# Patient Record
Sex: Female | Born: 1975 | Race: White | Hispanic: Yes | Marital: Married | Smoking: Never smoker
Health system: Southern US, Community
[De-identification: ages and names within clinical notes are randomized; demographics above are authoritative.]

## PROBLEM LIST (undated history)

## (undated) HISTORY — PX: TUBAL LIGATION: SHX77

---

## 2007-11-22 ENCOUNTER — Emergency Department (HOSPITAL_COMMUNITY): Admission: EM | Admit: 2007-11-22 | Discharge: 2007-11-22 | Payer: Self-pay | Admitting: Emergency Medicine

## 2010-10-01 NOTE — Consult Note (Signed)
NAMEMAHA, FISCHEL NO.:  [id number]   MEDICAL RECORD NO.:  [id number]          PATIENT TYPE:  EMS   LOCATION:  [facility]                         FACILITY:  [facility]   PHYSICIAN:  Artist Pais. Weingold, M.D.DATE OF BIRTH:  1976/05/19   DATE OF CONSULTATION:  11/22/2007  DATE OF DISCHARGE:                                 CONSULTATION   REASON FOR CONSULTATION:  Miranda Gates is a 35 year old female.  She is  right-hand dominant.  She presents today with injury to right index  finger with open nail bed laceration, distal phalangeal fracture, gross  contamination.  She is 28.  She is right-hand dominant.   No known drug allergies.   No current medications.   No recent hospitalizations or surgery.   FAMILY HISTORY:  Noncontributory.   SOCIAL HISTORY:  Noncontributory.   PHYSICAL EXAMINATION:  A well-nourished female, pleasant, alert,  oriented x3.  Examination of her upper extremity on the right, she has  an obvious open nail bed injury with exposed distal phalangeal bone  avulsion of the nail plate underneath the eponychial fold, open  fracture, etc.   X-ray showed distal phalangeal fracture.   She was given 2% plain lidocaine and a digital sheath block.  When  anesthesia obtained, she was prepped and draped in usual sterile  fashion.  The nail plate was carefully elevated off underlying nail bed.  Open fracture was debrided.  The nail bed itself was repaired with 6-0  chromic followed by 4-0 Vicryl Rapide, volar complex skin laceration.  Once this was done, she was placed in a sterile dressing, Xeroform,  4x4s, and Coban wrap.  The patient was discharged from emergency  department with Vicodin and Keflex.  Follow up with my office this  Thursday.      Artist Pais Mina Marble, M.D.  Electronically Signed     MAW/MEDQ  D:  11/22/2007  T:  11/23/2007  Job:  [id number]

## 2010-11-19 ENCOUNTER — Other Ambulatory Visit: Payer: Self-pay | Admitting: Internal Medicine

## 2010-11-19 ENCOUNTER — Other Ambulatory Visit: Payer: Self-pay | Admitting: Obstetrics and Gynecology

## 2010-11-19 DIAGNOSIS — N6320 Unspecified lump in the left breast, unspecified quadrant: Secondary | ICD-10-CM

## 2010-11-19 DIAGNOSIS — N644 Mastodynia: Secondary | ICD-10-CM

## 2010-11-29 ENCOUNTER — Other Ambulatory Visit: Payer: Self-pay

## 2010-12-02 ENCOUNTER — Ambulatory Visit
Admission: RE | Admit: 2010-12-02 | Discharge: 2010-12-02 | Disposition: A | Discharge status: Home / Self Care | Payer: Self-pay | Source: Ambulatory Visit | Attending: Internal Medicine | Admitting: Internal Medicine

## 2010-12-02 ENCOUNTER — Other Ambulatory Visit: Payer: Self-pay | Admitting: Internal Medicine

## 2010-12-02 DIAGNOSIS — N644 Mastodynia: Secondary | ICD-10-CM

## 2010-12-02 DIAGNOSIS — N6320 Unspecified lump in the left breast, unspecified quadrant: Secondary | ICD-10-CM

## 2010-12-05 ENCOUNTER — Ambulatory Visit
Admission: RE | Admit: 2010-12-05 | Discharge: 2010-12-05 | Disposition: A | Discharge status: Home / Self Care | Payer: Self-pay | Source: Ambulatory Visit | Attending: Internal Medicine | Admitting: Internal Medicine

## 2010-12-05 DIAGNOSIS — N6320 Unspecified lump in the left breast, unspecified quadrant: Secondary | ICD-10-CM

## 2010-12-05 DIAGNOSIS — N644 Mastodynia: Secondary | ICD-10-CM

## 2015-09-27 ENCOUNTER — Other Ambulatory Visit: Payer: Self-pay

## 2015-09-27 ENCOUNTER — Other Ambulatory Visit: Payer: Self-pay | Admitting: Specialist

## 2015-09-27 DIAGNOSIS — N6002 Solitary cyst of left breast: Secondary | ICD-10-CM

## 2015-10-22 ENCOUNTER — Other Ambulatory Visit (HOSPITAL_COMMUNITY): Payer: Self-pay | Admitting: *Deleted

## 2015-10-22 DIAGNOSIS — N6002 Solitary cyst of left breast: Secondary | ICD-10-CM

## 2015-11-08 ENCOUNTER — Ambulatory Visit
Admission: RE | Admit: 2015-11-08 | Discharge: 2015-11-08 | Disposition: A | Discharge status: Home / Self Care | Payer: No Typology Code available for payment source | Source: Ambulatory Visit | Attending: Obstetrics and Gynecology | Admitting: Obstetrics and Gynecology

## 2015-11-08 ENCOUNTER — Encounter (HOSPITAL_COMMUNITY): Payer: Self-pay

## 2015-11-08 ENCOUNTER — Ambulatory Visit (HOSPITAL_COMMUNITY)
Admission: RE | Admit: 2015-11-08 | Discharge: 2015-11-08 | Disposition: A | Discharge status: Home / Self Care | Payer: Self-pay | Source: Ambulatory Visit | Attending: Obstetrics and Gynecology | Admitting: Obstetrics and Gynecology

## 2015-11-08 VITALS — BP 102/60 | Temp 98.2°F | Ht 64.0 in | Wt 187.2 lb

## 2015-11-08 DIAGNOSIS — N6002 Solitary cyst of left breast: Secondary | ICD-10-CM

## 2015-11-08 DIAGNOSIS — Z1239 Encounter for other screening for malignant neoplasm of breast: Secondary | ICD-10-CM

## 2015-11-08 DIAGNOSIS — N644 Mastodynia: Secondary | ICD-10-CM

## 2015-11-08 NOTE — Patient Instructions (Signed)
Educational materials on self breast awareness given. Explained to [facility] Jayme CloudGonzalez that she did not need a Pap smear today due to last Pap smear was in April 2017 per patient. Let her know BCCCP will cover Pap smears every 3 years unless has a history of abnormal Pap smears. [facility]. Appointment scheduled for Thursday, November 08, 2015 at 1000. Rubin PayorEdith Delagarza Jayme CloudGonzalez verbalized understanding.  Romualdo Prosise, Kathaleen Maserhristine Poll, RN 11:34 AM

## 2015-11-08 NOTE — Progress Notes (Signed)
Complaints of occasional breast pain prior to menstrual cycles. Patient had a left breast ultrasound guided aspiration 12/05/2010 that a left breast ultrasound was recommended in 3 months. Patient has not followed up since aspiration.   Pap Smear:  Pap smear not completed today. Last Pap smear was in April 2017 at Urgent Care and normal per patient. Per patient has no history of an abnormal Pap smear. No Pap smear results are in EPIC.  Physical exam: Breasts Breasts symmetrical. No skin abnormalities bilateral breasts. No nipple retraction bilateral breasts. No nipple discharge bilateral breasts. No lymphadenopathy. No lumps palpated bilateral breasts. Complaints of left outer breast pain on exam. [facility]. Appointment scheduled for Thursday, November 08, 2015 at 1000.  Pelvic/Bimanual No Pap smear completed today since last Pap smear was in April 2017. Pap smear not indicated per BCCCP guidelines.   Smoking History: Patient has never smoked.  Patient Navigation: Patient education provided. Access to services provided for patient [facility]. Spanish interpreter provided.    Used Spanish interpreter [employer] from [employer].

## 2015-11-12 ENCOUNTER — Encounter (HOSPITAL_COMMUNITY): Payer: Self-pay | Admitting: *Deleted

## 2015-11-14 ENCOUNTER — Telehealth: Payer: Self-pay

## 2015-11-14 NOTE — Telephone Encounter (Signed)
Called per Albertina SenegalMarly Adams  to remind of WISEWOMAN appointment and to be fasting. [facility]

## 2015-11-15 ENCOUNTER — Other Ambulatory Visit: Payer: Self-pay

## 2015-11-15 ENCOUNTER — Ambulatory Visit: Payer: Self-pay

## 2015-11-15 VITALS — BP 100/66 | HR 62 | Temp 98.2°F | Resp 14 | Ht 63.0 in | Wt 183.4 lb

## 2015-11-15 DIAGNOSIS — Z0000 Encounter for general adult medical examination without abnormal findings: Secondary | ICD-10-CM

## 2015-11-15 LAB — LIPID PANEL
CHOL/HDL RATIO: 3.1 ratio (ref 0.0–4.4)
Cholesterol, Total: 147 mg/dL (ref 100–199)
HDL: 47 mg/dL (ref >39)
LDL Calculated: 81 mg/dL (ref 0–99)
TRIGLYCERIDES: 97 mg/dL (ref 0–149)
VLDL Cholesterol Cal: 19 mg/dL (ref 5–40)

## 2015-11-15 NOTE — Progress Notes (Signed)
Patient [facility] and is currently a BCCCP patient effective 08/23/2015. Patient is present with interpreter Albertina SenegalMarly Adams.  Clinical Measurements: Patient is 5 ft. 3 inches, weight 183.4 lbs, and BMI 32.6.   Medical History: Patient stated that was told two years ago that her cholesterol was a little high but nothing to worry about. Patient does not have a history of hypertension but did have gestational diabetes during last pregnancy. Per patient no diagnosed history of coronary heart disease, heart attack, heart failure, stroke/TIA, vascular disease or congenital heart defects.   Blood Pressure, Self-measurement: Patient states has no reason to check Blood pressure.  Nutrition Assessment: Patient stated that eats 2 to 3 fruits every day. Patient states she eats 2 servings of vegetables a day. Per patient does not eat 3 or more ounces of whole grains daily. Patient doesn't eat two or more servings of fish weekly. Patient states that eats fish once a week. Patient states she does not drink more than 36 ounces or 450 calories of beverages with added sugars weekly. Patient states she drinks a lot of water. Patient stated she does not  watch her salt intake. Wellness health coaching was inititated at this time.  Physical Activity Assessment: Patient stated she walks faster when her weight gets to 185 or above.  Patient stated has probably been doing 195 minutes of moderate exercise a week and no vigorous exercise.  Smoking Status: Patient stated that has never smoked and is not exposed to smoke.  Quality of Life Assessment: In assessing patient's physical quality of life she stated that out of the past 30 days that she has felt her health was good all days. Patient also stated that in the past 30 days that her mental health was  good including stress, depression and problems with emotions for all days. Patient did state that out of the past 30 days she felt her physical or  mental health had not kept her from doing her usual activities including self-care, work or recreation for all days.   Plan: Lab work will be done today including a lipid panel and Hgb A1C. Will call lab results when they are finished. Patient will receive Second Health Coaching when results are called and then as patient wants.

## 2015-11-15 NOTE — Patient Instructions (Signed)
Discussed health assessment with patient. Will increase amount of omega 3 fish eats per week. She will be called with results of lab work and we will then discussed any further follow up the patient needs. Patient verbalized understanding.

## 2015-11-15 NOTE — Progress Notes (Signed)
FIRST HEALTH COACH: During initial screening with Albertina SenegalMarly Adams interpreter present, wellness health coaching was started.  Nutrition: Discussed with patient that normally should eat 2 servings of fruits and 2 to 2 1/2 vegetables a day. Informed patient that two servings of fish were recommended per week and prefer omega 3's. Listed the fishes with omega three's for patient.Discussed that should have 5 to 7 ounces of fiber a day. Explained to patient that should eat fruits and vegetables high in fiber but 3 of her 5 to 7 grains of fiber should be whole grain. Examples of whole grains were given.   Informed about sugar and told need to decrease salt intake.   Physical Activity:  Patient was Informed that minimal moderate activity is 150 minutes a week or 75 minutes of vigorous. Patient is doing her minimal moderate exercise. Discussed adding in some flexeibily and balance exercises.  PLAN: Will set goal when call lab results and do second health coaching. Will think about what goals may want to set.

## 2015-11-16 ENCOUNTER — Telehealth: Payer: Self-pay

## 2015-11-16 LAB — HEMOGLOBIN A1C
ESTIMATED AVERAGE GLUCOSE: 108 mg/dL
HEMOGLOBIN A1C: 5.4 % (ref 4.8–5.6)

## 2015-11-16 NOTE — Telephone Encounter (Addendum)
LAB RESULTS: Patient was called by Miranda Gates interpreter to inform about lab work from 11/15/15. Interpreter informed patient: cholesterol- 147, HDL- 47, LDL- 81, triglycerides - 97,  HBG-A1C - 5.4, and BMI 32.6 (obese).  SECOND HEALTH COACHING: Did risk reduction counseling/Heach Coach concerning related to BMI/Weight.  We discussed how starches break down into sugar in the body and the need to watch size and number of servings that eat a day. Discussed the need to loose weight, cut back calories, serving sizes,not fry, and exercise more.. Examples were given of each of the past subject matter.  Patient stated that thought could loose weight but would like to come in for one on one health coaching to make sure she knows how to eat healthier.  PLAN: Patient will put above recommendation slowly into action. Patient will come for one on one Health Coaching July 13.

## 2015-11-28 ENCOUNTER — Telehealth: Payer: Self-pay

## 2015-11-28 NOTE — Telephone Encounter (Signed)
Called per Miranda SenegalMarly Adams to remind of [facility]. Reminded patient of  [facility]

## 2015-11-29 ENCOUNTER — Ambulatory Visit: Payer: No Typology Code available for payment source

## 2015-12-27 ENCOUNTER — Telehealth: Payer: Self-pay

## 2015-12-27 NOTE — Telephone Encounter (Signed)
THIRD HEALTH COACHING Patient called per phone today for Health Coaching with interpreter Albertina SenegalMarly Adams present. Patient's areas of concern were weight and exercise.  HEALTH COACHING: Patient stated had decreased the amount of food she is eating. Per patient has increased the amount of vegetables that she is eating. Patient stated that she has lost 4 pounds. States is watching her portion sizes and trying to eat better. Per patient is exercising 45 minutes on most days.  PLAN: Will Call in a few weeks. Will continue with increasing exercise. Will continue with the good meal and nutrition changes. Will be doing follow up assessment when call next.

## 2016-06-30 ENCOUNTER — Telehealth: Payer: Self-pay

## 2016-06-30 NOTE — Telephone Encounter (Signed)
Wisewoman - Follow up Screening  Assessment: F/U screening.   Patient's goal was to lose weight and exercise  Initial Screening date:11/15/2015  Medication Status: Patient is not currently taking any medications  Blood Pressure Measurement: Patient is not taking BP at this time  Nutrition: patient consumes 2 cups on average of vegetables and fruit daily, eats fish once weekly, Eats whole grains but not more than 3 ounces daily, does not drink any sugary drinks and is not currently watching her sodium intake.   Physical Activity: Patient has moderate to vigorous physical activity 30 minutes twice weekly  Smoking Status: Patient is not a current smoker  Quality of Life: Patient denies any emotional issues at this time.  Navigation:  Access through [facility]ise[facility] program provided.

## 2016-11-05 ENCOUNTER — Other Ambulatory Visit (HOSPITAL_COMMUNITY): Payer: Self-pay | Admitting: *Deleted

## 2016-11-05 DIAGNOSIS — N6320 Unspecified lump in the left breast, unspecified quadrant: Secondary | ICD-10-CM

## 2016-11-07 ENCOUNTER — Other Ambulatory Visit (HOSPITAL_COMMUNITY): Payer: Self-pay | Admitting: *Deleted

## 2016-11-07 DIAGNOSIS — N644 Mastodynia: Secondary | ICD-10-CM

## 2016-11-07 DIAGNOSIS — N6320 Unspecified lump in the left breast, unspecified quadrant: Secondary | ICD-10-CM

## 2016-11-20 ENCOUNTER — Ambulatory Visit
Admission: RE | Admit: 2016-11-20 | Discharge: 2016-11-20 | Disposition: A | Discharge status: Home / Self Care | Payer: No Typology Code available for payment source | Source: Ambulatory Visit | Attending: Obstetrics and Gynecology | Admitting: Obstetrics and Gynecology

## 2016-11-20 ENCOUNTER — Ambulatory Visit (HOSPITAL_COMMUNITY)
Admission: RE | Admit: 2016-11-20 | Discharge: 2016-11-20 | Disposition: A | Discharge status: Home / Self Care | Payer: Self-pay | Source: Ambulatory Visit | Attending: Obstetrics and Gynecology | Admitting: Obstetrics and Gynecology

## 2016-11-20 ENCOUNTER — Encounter (HOSPITAL_COMMUNITY): Payer: Self-pay | Admitting: *Deleted

## 2016-11-20 VITALS — BP 108/64 | Ht 63.0 in | Wt 178.2 lb

## 2016-11-20 DIAGNOSIS — N6320 Unspecified lump in the left breast, unspecified quadrant: Secondary | ICD-10-CM

## 2016-11-20 DIAGNOSIS — N6325 Unspecified lump in the left breast, overlapping quadrants: Secondary | ICD-10-CM

## 2016-11-20 DIAGNOSIS — N6321 Unspecified lump in the left breast, upper outer quadrant: Secondary | ICD-10-CM

## 2016-11-20 DIAGNOSIS — Z1239 Encounter for other screening for malignant neoplasm of breast: Secondary | ICD-10-CM

## 2016-11-20 DIAGNOSIS — N644 Mastodynia: Secondary | ICD-10-CM

## 2016-11-20 NOTE — Progress Notes (Signed)
Complaints of left breast lump x 1.5 months and left breast pain x one month that comes and goes. Patient rates the pain at a 7-8 out of 10.  Pap Smear: Pap smear not completed today. Last Pap smear was in April 2017 at an Urgent Care and normal per patient. Per patient has no history of an abnormal Pap smear. No Pap smear results are in EPIC.  Physical exam: Breasts Breasts symmetrical. No skin abnormalities bilateral breasts. No nipple retraction bilateral breasts. No nipple discharge bilateral breasts. No lymphadenopathy. No lumps palpated right breast. Palpated two lumps within the left breast at 2 o'clock 2 cm from the nipple and 3 o'clock 2 cm from the nipple. Complaints of bilateral outer breast tenderness and pain when palpated lump on exam. [facility] and possible left breast ultrasound. Appointment scheduled for Thursday, November 20, 2016 at 0850.        Pelvic/Bimanual No Pap smear completed today since last Pap smear was in April 2017 per patient. Pap smear not indicated per BCCCP guidelines.   Smoking History: Patient has never smoked.  Patient Navigation: Patient education provided. Access to services provided for patient through [facility]. Spanish interpreter was provided.  Used Spanish interpreter [employer] McReynolds from [employer].

## 2016-11-20 NOTE — Patient Instructions (Signed)
Explained breast self awareness with Miranda Gates. Patient did not need a Pap smear today due to last Pap smear was in April 2017 per patient. Let her know BCCCP will cover Pap smears every 3 years unless has a history of abnormal Pap smears. [facility] and possible left breast ultrasound. Appointment scheduled for Thursday, November 20, 2016 at 0850. Rubin PayorEdith Delagarza Jayme CloudGonzalez verbalized understanding.  Honesti Seaberg, Kathaleen Maserhristine Poll, RN 8:57 AM

## 2016-11-21 ENCOUNTER — Encounter (HOSPITAL_COMMUNITY): Payer: Self-pay | Admitting: *Deleted

## 2017-03-09 ENCOUNTER — Encounter (HOSPITAL_COMMUNITY): Payer: Self-pay

## 2017-12-04 ENCOUNTER — Other Ambulatory Visit: Payer: Self-pay | Admitting: Obstetrics and Gynecology

## 2017-12-04 DIAGNOSIS — Z1231 Encounter for screening mammogram for malignant neoplasm of breast: Secondary | ICD-10-CM

## 2018-01-21 ENCOUNTER — Ambulatory Visit (HOSPITAL_COMMUNITY): Payer: Self-pay

## 2018-04-20 ENCOUNTER — Ambulatory Visit: Payer: No Typology Code available for payment source

## 2018-04-20 ENCOUNTER — Ambulatory Visit (HOSPITAL_COMMUNITY): Payer: Self-pay

## 2018-05-18 ENCOUNTER — Encounter (HOSPITAL_COMMUNITY): Payer: Self-pay | Admitting: *Deleted

## 2018-11-01 ENCOUNTER — Emergency Department (HOSPITAL_BASED_OUTPATIENT_CLINIC_OR_DEPARTMENT_OTHER): Payer: Self-pay

## 2018-11-01 ENCOUNTER — Encounter (HOSPITAL_BASED_OUTPATIENT_CLINIC_OR_DEPARTMENT_OTHER): Payer: Self-pay | Admitting: *Deleted

## 2018-11-01 ENCOUNTER — Other Ambulatory Visit: Payer: Self-pay

## 2018-11-01 ENCOUNTER — Emergency Department (HOSPITAL_BASED_OUTPATIENT_CLINIC_OR_DEPARTMENT_OTHER)
Admission: EM | Admit: 2018-11-01 | Discharge: 2018-11-02 | Disposition: A | Discharge status: Home / Self Care | Payer: Self-pay | Attending: Emergency Medicine | Admitting: Emergency Medicine

## 2018-11-01 DIAGNOSIS — M7918 Myalgia, other site: Secondary | ICD-10-CM | POA: Insufficient documentation

## 2018-11-01 DIAGNOSIS — U071 COVID-19: Secondary | ICD-10-CM | POA: Insufficient documentation

## 2018-11-01 DIAGNOSIS — R51 Headache: Secondary | ICD-10-CM | POA: Insufficient documentation

## 2018-11-01 LAB — BASIC METABOLIC PANEL
Anion gap: 8 (ref 5–15)
BUN: 6 mg/dL (ref 6–20)
CO2: 24 mmol/L (ref 22–32)
Calcium: 8.3 mg/dL — ABNORMAL LOW (ref 8.9–10.3)
Chloride: 103 mmol/L (ref 98–111)
Creatinine, Ser: 0.79 mg/dL (ref 0.44–1.00)
GFR calc Af Amer: >60 mL/min (ref >60)
GFR calc non Af Amer: >60 mL/min (ref >60)
Glucose, Bld: 133 mg/dL — ABNORMAL HIGH (ref 70–99)
Potassium: 3.8 mmol/L (ref 3.5–5.1)
Sodium: 135 mmol/L (ref 135–145)

## 2018-11-01 LAB — CBC WITH DIFFERENTIAL/PLATELET
Abs Immature Granulocytes: 0.02 10*3/uL (ref 0.00–0.07)
Basophils Absolute: 0 10*3/uL (ref 0.0–0.1)
Basophils Relative: 0 %
Eosinophils Absolute: 0 10*3/uL (ref 0.0–0.5)
Eosinophils Relative: 0 %
HCT: 39.9 % (ref 36.0–46.0)
Hemoglobin: 12.5 g/dL (ref 12.0–15.0)
Immature Granulocytes: 0 %
Lymphocytes Relative: 7 %
Lymphs Abs: 0.6 10*3/uL — ABNORMAL LOW (ref 0.7–4.0)
MCH: 27.5 pg (ref 26.0–34.0)
MCHC: 31.3 g/dL (ref 30.0–36.0)
MCV: 87.9 fL (ref 80.0–100.0)
Monocytes Absolute: 0.5 10*3/uL (ref 0.1–1.0)
Monocytes Relative: 6 %
Neutro Abs: 6.9 10*3/uL (ref 1.7–7.7)
Neutrophils Relative %: 87 %
Platelets: 223 10*3/uL (ref 150–400)
RBC: 4.54 MIL/uL (ref 3.87–5.11)
RDW: 13.4 % (ref 11.5–15.5)
WBC: 8 10*3/uL (ref 4.0–10.5)
nRBC: 0 % (ref 0.0–0.2)

## 2018-11-01 LAB — URINALYSIS, ROUTINE W REFLEX MICROSCOPIC
Bilirubin Urine: NEGATIVE
Glucose, UA: NEGATIVE mg/dL
Hgb urine dipstick: NEGATIVE
Ketones, ur: NEGATIVE mg/dL
Nitrite: NEGATIVE
Protein, ur: NEGATIVE mg/dL
Specific Gravity, Urine: 1.01 (ref 1.005–1.030)
pH: 7.5 (ref 5.0–8.0)

## 2018-11-01 LAB — PREGNANCY, URINE: Preg Test, Ur: NEGATIVE

## 2018-11-01 LAB — URINALYSIS, MICROSCOPIC (REFLEX)

## 2018-11-01 LAB — GROUP A STREP BY PCR: Group A Strep by PCR: NOT DETECTED

## 2018-11-01 LAB — SARS CORONAVIRUS 2 AG (30 MIN TAT): SARS Coronavirus 2 Ag: POSITIVE — AB

## 2018-11-01 MED ORDER — SODIUM CHLORIDE 0.9 % IV BOLUS
1000.0000 mL | Freq: Once | INTRAVENOUS | Status: AC
  Administered 2018-11-01: 1000 mL via INTRAVENOUS

## 2018-11-01 MED ORDER — DIPHENHYDRAMINE HCL 50 MG/ML IJ SOLN
25.0000 mg | Freq: Once | INTRAMUSCULAR | Status: AC
Start: 2018-11-01 — End: 2018-11-01
  Administered 2018-11-01: 25 mg via INTRAVENOUS
  Filled 2018-11-01: qty 1

## 2018-11-01 MED ORDER — IBUPROFEN 800 MG PO TABS
800.0000 mg | ORAL_TABLET | Freq: Once | ORAL | Status: AC
  Administered 2018-11-01: 800 mg via ORAL
  Filled 2018-11-01: qty 1

## 2018-11-01 MED ORDER — METOCLOPRAMIDE HCL 5 MG/ML IJ SOLN
10.0000 mg | Freq: Once | INTRAMUSCULAR | Status: AC
  Administered 2018-11-01: 10 mg via INTRAVENOUS
  Filled 2018-11-01: qty 2

## 2018-11-01 NOTE — ED Triage Notes (Signed)
Fever and headache this am.

## 2018-11-01 NOTE — ED Provider Notes (Signed)
[facility] EMERGENCY DEPARTMENT Provider Note   CSN: [id number] Arrival date & time: 11/01/18  2011    History   Chief Complaint Chief Complaint  Patient presents with  . Fever  . Headache    HPI Miranda Gates is a 43 y.o. female.     The history is provided by the patient.  Fever Max temp prior to arrival:  102.6 Temp source:  Oral Duration:  1 day (Patient woke early this morning with a headache and fever.  She was asymptomatic yesterday.) Timing:  Constant Chronicity:  New Relieved by:  Nothing Worsened by:  Nothing Ineffective treatments:  Acetaminophen (Patient also had a tele-visit with her PCP this afternoon and was prescribed to help with Fioricet which she last took at 5 PM which did not relieve her headache or her fever symptoms.) Associated symptoms: headaches and myalgias   Associated symptoms: no chest pain, no chills, no confusion, no congestion, no cough, no diarrhea, no dysuria, no ear pain, no nausea, no rash, no rhinorrhea, no somnolence, no sore throat and no vomiting   Associated symptoms comment:  She reports pain across her forehead and pressure behind her eyes.  She also reports aching in her upper teeth.  She has had no nasal congestion, nosebleeds or nasal drainage. Risk factors: no occupational exposure, no recent sickness, no recent travel and no sick contacts   Risk factors comment:  Patient also denies rash, insect bites, specifically no known tick bites. Headache Associated symptoms: fever and myalgias   Associated symptoms: no abdominal pain, no back pain, no congestion, no cough, no diarrhea, no dizziness, no ear pain, no nausea, no neck pain, no neck stiffness, no numbness, no photophobia, no sore throat, no vomiting and no weakness     History reviewed. No pertinent past medical history.  There are no active problems to display for this patient.   Past Surgical History:  Procedure Laterality Date  . CESAREAN  SECTION     3 previous  . TUBAL LIGATION       OB History    Gravida  4   Para      Term      Preterm      AB      Living  4     SAB      TAB      Ectopic      Multiple      Live Births               Home Medications    Prior to Admission medications   Not on File    Family History Family History  Problem Relation Age of Onset  . Diabetes Father   . Thyroid disease Sister   . Hypertension Maternal Grandmother   . Hypertension Paternal Grandmother     Social History Social History   Tobacco Use  . Smoking status: Never Smoker  . Smokeless tobacco: Never Used  Substance Use Topics  . Alcohol use: Yes    Comment: rarely  . Drug use: No     Allergies   Patient has no known allergies.   Review of Systems Review of Systems  Constitutional: Positive for fever. Negative for chills.  HENT: Negative for congestion, ear pain, mouth sores, nosebleeds, rhinorrhea, sore throat and trouble swallowing.   Eyes: Negative for photophobia.  Respiratory: Negative for cough and shortness of breath.   Cardiovascular: Negative for chest pain.  Gastrointestinal: Negative for abdominal pain, diarrhea,  nausea and vomiting.  Genitourinary: Negative for dysuria and pelvic pain.  Musculoskeletal: Positive for myalgias. Negative for back pain, neck pain and neck stiffness.  Skin: Negative for rash.  Neurological: Positive for headaches. Negative for dizziness, weakness and numbness.  Psychiatric/Behavioral: Negative for confusion.     Physical Exam Updated Vital Signs BP 134/71   Pulse (!) 122   Temp 100.2 F (37.9 C) (Oral)   Resp 20   Ht 5\' 6"  (1.676 m)   Wt 74.8 kg   LMP 10/23/2018   SpO2 98%   BMI 26.63 kg/m   Physical Exam Vitals signs and nursing note reviewed.  Constitutional:      Appearance: She is well-developed.  HENT:     Head: Normocephalic and atraumatic.     Comments: Patient has mild tenderness palpation across her forehead, not  particularly localizing to her frontal sinuses.  There is some chemosis like edema of her upper eyelids.    Right Ear: Tympanic membrane and ear canal normal.     Left Ear: Tympanic membrane and ear canal normal.     Nose: Nose normal.     Right Sinus: No maxillary sinus tenderness.     Left Sinus: No maxillary sinus tenderness.  Eyes:     Conjunctiva/sclera: Conjunctivae normal.  Neck:     Musculoskeletal: Normal range of motion and neck supple. No neck rigidity.  Cardiovascular:     Rate and Rhythm: Regular rhythm. Tachycardia present.     Heart sounds: Normal heart sounds.  Pulmonary:     Effort: Pulmonary effort is normal.     Breath sounds: Normal breath sounds. No wheezing.  Abdominal:     General: Bowel sounds are normal.     Palpations: Abdomen is soft.     Tenderness: There is no abdominal tenderness.  Musculoskeletal: Normal range of motion.  Lymphadenopathy:     Cervical: No cervical adenopathy.  Skin:    General: Skin is warm and dry.     Findings: No rash.  Neurological:     Mental Status: She is alert and oriented to person, place, and time.      ED Treatments / Results  Labs (all labs ordered are listed, but only abnormal results are displayed) Labs Reviewed  CBC WITH DIFFERENTIAL/PLATELET - Abnormal; Notable for the following components:      Result Value   Lymphs Abs 0.6 (*)    All other components within normal limits  BASIC METABOLIC PANEL - Abnormal; Notable for the following components:   Glucose, Bld 133 (*)    Calcium 8.3 (*)    All other components within normal limits  SARS CORONAVIRUS 2 (HOSP ORDER, PERFORMED IN [place] LAB VIA ABBOTT ID)  PREGNANCY, URINE  URINALYSIS, ROUTINE W REFLEX MICROSCOPIC    EKG None  Radiology Ct Head Wo Contrast  Result Date: 11/01/2018 CLINICAL DATA:  43 y/o F; headache and fever, possible frontal sinusitis. EXAM: CT HEAD WITHOUT CONTRAST TECHNIQUE: Contiguous axial images were obtained from the base of  the skull through the vertex without intravenous contrast. COMPARISON:  None. FINDINGS: Brain: No evidence of acute infarction, hemorrhage, hydrocephalus, extra-axial collection or mass lesion/mass effect. Vascular: No hyperdense vessel or unexpected calcification. Skull: Normal. Negative for fracture or focal lesion. Sinuses/Orbits: No acute finding. Other: None. IMPRESSION: Negative CT of the head.  No findings of sinusitis. Electronically Signed   By: Mitzi HansenLance  Furusawa-Stratton M.D.   On: 11/01/2018 21:37    Procedures Procedures (including critical care time)  Medications Ordered in ED Medications  sodium chloride 0.9 % bolus 1,000 mL (has no administration in time range)  metoCLOPramide (REGLAN) injection 10 mg (has no administration in time range)  diphenhydrAMINE (BENADRYL) injection 25 mg (has no administration in time range)  ibuprofen (ADVIL) tablet 800 mg (800 mg Oral Given 11/01/18 2106)     Initial Impression / Assessment and Plan / ED Course  I have reviewed the triage vital signs and the nursing notes.  Pertinent labs & imaging results that were available during my care of the patient were reviewed by me and considered in my medical decision making (see chart for details).        Pt with headache and fever of unclear etiology, probably viral process, but without signs suggesting meningitis. Ct imaging negative for acute sinusitis. Labs including covid testing pending. reglan and benadryl given for headache, fluids,  ibuprofen PO for h/a and fever.    Pt discussed with Dr. Clarice PolePfeifer who assumes care.   Final Clinical Impressions(s) / ED Diagnoses   Final diagnoses:  None    ED Discharge Orders    None       Victoriano Laindol, Owyn Raulston, PA-C 11/01/18 2239    Arby BarrettePfeiffer, Marcy, MD 11/01/18 2309

## 2018-11-02 NOTE — ED Provider Notes (Signed)
Ambulated 100% on ROOM air.  Covid isolation agreement signed.  Placed on 14 days home quarantine.  Work not given for 14 days.     Miranda Gates was evaluated in Emergency Department on 11/02/2018 for the symptoms described in the history of present illness. She was evaluated in the context of the global COVID-19 pandemic, which necessitated consideration that the patient might be at risk for infection with the SARS-CoV-2 virus that causes COVID-19. Institutional protocols and algorithms that pertain to the evaluation of patients at risk for COVID-19 are in a state of rapid change based on information released by regulatory bodies including the CDC and federal and state organizations. These policies and algorithms were followed during the patient's care in the ED.   Miranda Grimm, MD 11/02/18 3559

## 2018-11-02 NOTE — Discharge Instructions (Addendum)
Person Under Monitoring Name: Miranda Gates  Location: [place] [address]   Infection Prevention Recommendations for Individuals Confirmed to have, or Being Evaluated for, 2019 Novel Coronavirus (COVID-19) Infection Who Receive Care at Home  Individuals who are confirmed to have, or are being evaluated for, COVID-19 should follow the prevention steps below until a healthcare provider or local or state health department says they can return to normal activities.  Stay home except to get medical care You should restrict activities outside your home, except for getting medical care. Do not go to work, school, or public areas, and do not use public transportation or taxis.  Call ahead before visiting your doctor Before your medical appointment, call the healthcare provider and tell them that you have, or are being evaluated for, COVID-19 infection. This will help the healthcare providers office take steps to keep other people from getting infected. Ask your healthcare provider to call the local or state health department.  Monitor your symptoms Seek prompt medical attention if your illness is worsening (e.g., difficulty breathing). Before going to your medical appointment, call the healthcare provider and tell them that you have, or are being evaluated for, COVID-19 infection. Ask your healthcare provider to call the local or state health department.  Wear a facemask You should wear a facemask that covers your nose and mouth when you are in the same room with other people and when you visit a healthcare provider. People who live with or visit you should also wear a facemask while they are in the same room with you.  Separate yourself from other people in your home As much as possible, you should stay in a different room from other people in your home. Also, you should use a separate bathroom, if available.  Avoid sharing household items You  should not share dishes, drinking glasses, cups, eating utensils, towels, bedding, or other items with other people in your home. After using these items, you should wash them thoroughly with soap and water.  Cover your coughs and sneezes Cover your mouth and nose with a tissue when you cough or sneeze, or you can cough or sneeze into your sleeve. Throw used tissues in a lined trash can, and immediately wash your hands with soap and water for at least 20 seconds or use an alcohol-based hand rub.  Wash your [facility] your hands often and thoroughly with soap and water for at least 20 seconds. You can use an alcohol-based hand sanitizer if soap and water are not available and if your hands are not visibly dirty. Avoid touching your eyes, nose, and mouth with unwashed hands.   Prevention Steps for Caregivers and Household Members of Individuals Confirmed to have, or Being Evaluated for, COVID-19 Infection Being Cared for in the Home  If you live with, or provide care at home for, a person confirmed to have, or being evaluated for, COVID-19 infection please follow these guidelines to prevent infection:  Follow healthcare providers instructions Make sure that you understand and can help the patient follow any healthcare provider instructions for all care.  Provide for the patients basic needs You should help the patient with basic needs in the home and provide support for getting groceries, prescriptions, and other personal needs.  Monitor the patients symptoms If they are getting sicker, call his or her medical provider and tell them that the patient has, or is being evaluated for, COVID-19 infection. This will help the healthcare providers  office take steps to keep other people from getting infected. Ask the healthcare provider to call the local or state health department.  Limit the number of people who have contact with the patient If possible, have only one caregiver for the  patient. Other household members should stay in another home or place of residence. If this is not possible, they should stay in another room, or be separated from the patient as much as possible. Use a separate bathroom, if available. Restrict visitors who do not have an essential need to be in the home.  Keep older adults, very young children, and other sick people away from the patient Keep older adults, very young children, and those who have compromised immune systems or chronic health conditions away from the patient. This includes people with chronic heart, lung, or kidney conditions, diabetes, and cancer.  Ensure good ventilation Make sure that shared spaces in the home have good air flow, such as from an air conditioner or an opened window, weather permitting.  Wash your hands often Wash your hands often and thoroughly with soap and water for at least 20 seconds. You can use an alcohol based hand sanitizer if soap and water are not available and if your hands are not visibly dirty. Avoid touching your eyes, nose, and mouth with unwashed hands. Use disposable paper towels to dry your hands. If not available, use dedicated cloth towels and replace them when they become wet.  Wear a facemask and gloves Wear a disposable facemask at all times in the room and gloves when you touch or have contact with the patients blood, body fluids, and/or secretions or excretions, such as sweat, saliva, sputum, nasal mucus, vomit, urine, or feces.  Ensure the mask fits over your nose and mouth tightly, and do not touch it during use. Throw out disposable facemasks and gloves after using them. Do not reuse. Wash your hands immediately after removing your facemask and gloves. If your personal clothing becomes contaminated, carefully remove clothing and launder. Wash your hands after handling contaminated clothing. Place all used disposable facemasks, gloves, and other waste in a lined container before  disposing them with other household waste. Remove gloves and wash your hands immediately after handling these items.  Do not share dishes, glasses, or other household items with the patient Avoid sharing household items. You should not share dishes, drinking glasses, cups, eating utensils, towels, bedding, or other items with a patient who is confirmed to have, or being evaluated for, COVID-19 infection. After the person uses these items, you should wash them thoroughly with soap and water.  Wash laundry thoroughly Immediately remove and wash clothes or bedding that have blood, body fluids, and/or secretions or excretions, such as sweat, saliva, sputum, nasal mucus, vomit, urine, or feces, on them. Wear gloves when handling laundry from the patient. Read and follow directions on labels of laundry or clothing items and detergent. In general, wash and dry with the warmest temperatures recommended on the label.  Clean all areas the individual has used often Clean all touchable surfaces, such as counters, tabletops, doorknobs, bathroom fixtures, toilets, phones, keyboards, tablets, and bedside tables, every day. Also, clean any surfaces that may have blood, body fluids, and/or secretions or excretions on them. Wear gloves when cleaning surfaces the patient has come in contact with. Use a diluted bleach solution (e.g., dilute bleach with 1 part bleach and 10 parts water) or a household disinfectant with a label that says EPA-registered for coronaviruses. To make a  bleach solution at home, add 1 tablespoon of bleach to 1 quart (4 cups) of water. For a larger supply, add  cup of bleach to 1 gallon (16 cups) of water. Read labels of cleaning products and follow recommendations provided on product labels. Labels contain instructions for safe and effective use of the cleaning product including precautions you should take when applying the product, such as wearing gloves or eye protection and making sure you  have good ventilation during use of the product. Remove gloves and wash hands immediately after cleaning.  Monitor yourself for signs and symptoms of illness Caregivers and household members are considered close contacts, should monitor their health, and will be asked to limit movement outside of the home to the extent possible. Follow the monitoring steps for close contacts listed on the symptom monitoring form.   ? If you have additional questions, contact your local health department or call the epidemiologist on call at [phone] (available 24/7). ? This guidance is subject to change. For the most up-to-date guidance from [facility], please refer to their website: [email or web address]

## 2018-11-06 ENCOUNTER — Encounter (HOSPITAL_BASED_OUTPATIENT_CLINIC_OR_DEPARTMENT_OTHER): Payer: Self-pay

## 2018-11-06 ENCOUNTER — Emergency Department (HOSPITAL_BASED_OUTPATIENT_CLINIC_OR_DEPARTMENT_OTHER): Payer: HRSA Program

## 2018-11-06 ENCOUNTER — Other Ambulatory Visit: Payer: Self-pay

## 2018-11-06 ENCOUNTER — Inpatient Hospital Stay (HOSPITAL_BASED_OUTPATIENT_CLINIC_OR_DEPARTMENT_OTHER)
Admission: EM | Admit: 2018-11-06 | Discharge: 2018-11-12 | DRG: 177 | Disposition: A | Discharge status: Home / Self Care | Payer: HRSA Program | Attending: Internal Medicine | Admitting: Internal Medicine

## 2018-11-06 DIAGNOSIS — U071 COVID-19: Secondary | ICD-10-CM | POA: Diagnosis present

## 2018-11-06 DIAGNOSIS — J1289 Other viral pneumonia: Secondary | ICD-10-CM | POA: Diagnosis present

## 2018-11-06 DIAGNOSIS — J069 Acute upper respiratory infection, unspecified: Secondary | ICD-10-CM | POA: Diagnosis not present

## 2018-11-06 DIAGNOSIS — Z720 Tobacco use: Secondary | ICD-10-CM

## 2018-11-06 DIAGNOSIS — F419 Anxiety disorder, unspecified: Secondary | ICD-10-CM | POA: Diagnosis present

## 2018-11-06 DIAGNOSIS — J1282 Pneumonia due to coronavirus disease 2019: Secondary | ICD-10-CM | POA: Diagnosis present

## 2018-11-06 DIAGNOSIS — R945 Abnormal results of liver function studies: Secondary | ICD-10-CM | POA: Diagnosis not present

## 2018-11-06 DIAGNOSIS — J9601 Acute respiratory failure with hypoxia: Secondary | ICD-10-CM | POA: Diagnosis present

## 2018-11-06 DIAGNOSIS — R0602 Shortness of breath: Secondary | ICD-10-CM | POA: Diagnosis not present

## 2018-11-06 LAB — COMPREHENSIVE METABOLIC PANEL
ALT: 31 U/L (ref 0–44)
AST: 38 U/L (ref 15–41)
Albumin: 2.8 g/dL — ABNORMAL LOW (ref 3.5–5.0)
Alkaline Phosphatase: 54 U/L (ref 38–126)
Anion gap: 11 (ref 5–15)
BUN: 9 mg/dL (ref 6–20)
CO2: 26 mmol/L (ref 22–32)
Calcium: 7.9 mg/dL — ABNORMAL LOW (ref 8.9–10.3)
Chloride: 101 mmol/L (ref 98–111)
Creatinine, Ser: 0.52 mg/dL (ref 0.44–1.00)
GFR calc Af Amer: >60 mL/min (ref >60)
GFR calc non Af Amer: >60 mL/min (ref >60)
Glucose, Bld: 117 mg/dL — ABNORMAL HIGH (ref 70–99)
Potassium: 3.5 mmol/L (ref 3.5–5.1)
Sodium: 138 mmol/L (ref 135–145)
Total Bilirubin: 0.4 mg/dL (ref 0.3–1.2)
Total Protein: 6.5 g/dL (ref 6.5–8.1)

## 2018-11-06 LAB — LACTATE DEHYDROGENASE: LDH: 201 U/L — ABNORMAL HIGH (ref 98–192)

## 2018-11-06 LAB — PROCALCITONIN: Procalcitonin: <0.1 ng/mL

## 2018-11-06 LAB — CBC WITH DIFFERENTIAL/PLATELET
Abs Immature Granulocytes: 0.02 10*3/uL (ref 0.00–0.07)
Basophils Absolute: 0 10*3/uL (ref 0.0–0.1)
Basophils Relative: 0 %
Eosinophils Absolute: 0 10*3/uL (ref 0.0–0.5)
Eosinophils Relative: 0 %
HCT: 35.7 % — ABNORMAL LOW (ref 36.0–46.0)
Hemoglobin: 11.5 g/dL — ABNORMAL LOW (ref 12.0–15.0)
Immature Granulocytes: 0 %
Lymphocytes Relative: 14 %
Lymphs Abs: 0.7 10*3/uL (ref 0.7–4.0)
MCH: 27.8 pg (ref 26.0–34.0)
MCHC: 32.2 g/dL (ref 30.0–36.0)
MCV: 86.4 fL (ref 80.0–100.0)
Monocytes Absolute: 0.2 10*3/uL (ref 0.1–1.0)
Monocytes Relative: 4 %
Neutro Abs: 4.3 10*3/uL (ref 1.7–7.7)
Neutrophils Relative %: 82 %
Platelets: 197 10*3/uL (ref 150–400)
RBC: 4.13 MIL/uL (ref 3.87–5.11)
RDW: 13.5 % (ref 11.5–15.5)
WBC: 5.2 10*3/uL (ref 4.0–10.5)
nRBC: 0 % (ref 0.0–0.2)

## 2018-11-06 LAB — FIBRINOGEN: Fibrinogen: 620 mg/dL — ABNORMAL HIGH (ref 210–475)

## 2018-11-06 LAB — D-DIMER, QUANTITATIVE: D-Dimer, Quant: 0.59 ug/mL-FEU — ABNORMAL HIGH (ref 0.00–0.50)

## 2018-11-06 LAB — LACTIC ACID, PLASMA: Lactic Acid, Venous: 1.3 mmol/L (ref 0.5–1.9)

## 2018-11-06 LAB — PREGNANCY, URINE: Preg Test, Ur: NEGATIVE

## 2018-11-06 LAB — FERRITIN: Ferritin: 144 ng/mL (ref 11–307)

## 2018-11-06 LAB — C-REACTIVE PROTEIN: CRP: 16.9 mg/dL — ABNORMAL HIGH (ref <1.0)

## 2018-11-06 LAB — TRIGLYCERIDES: Triglycerides: 158 mg/dL — ABNORMAL HIGH (ref <150)

## 2018-11-06 MED ORDER — ACETAMINOPHEN 500 MG PO TABS
1000.0000 mg | ORAL_TABLET | Freq: Four times a day (QID) | ORAL | Status: DC | PRN
  Administered 2018-11-06 – 2018-11-07 (×2): 1000 mg via ORAL
  Filled 2018-11-06 (×2): qty 2

## 2018-11-06 MED ORDER — SODIUM CHLORIDE 0.9 % IV SOLN
1000.0000 mL | INTRAVENOUS | Status: DC
  Administered 2018-11-06 – 2018-11-07 (×2): 1000 mL via INTRAVENOUS

## 2018-11-06 MED ORDER — KETOROLAC TROMETHAMINE 15 MG/ML IJ SOLN
15.0000 mg | Freq: Once | INTRAMUSCULAR | Status: AC
  Administered 2018-11-06: 15 mg via INTRAVENOUS
  Filled 2018-11-06: qty 1

## 2018-11-06 MED ORDER — MORPHINE SULFATE (PF) 4 MG/ML IV SOLN
4.0000 mg | Freq: Once | INTRAVENOUS | Status: AC
  Administered 2018-11-06: 19:00:00 4 mg via INTRAVENOUS
  Filled 2018-11-06: qty 1

## 2018-11-06 MED ORDER — BENZONATATE 100 MG PO CAPS
200.0000 mg | ORAL_CAPSULE | Freq: Three times a day (TID) | ORAL | Status: DC | PRN
  Administered 2018-11-06 – 2018-11-12 (×10): 200 mg via ORAL
  Filled 2018-11-06 (×9): qty 2

## 2018-11-06 NOTE — ED Notes (Signed)
[facility] notified (Jaime) - patient ready for transport 

## 2018-11-06 NOTE — ED Notes (Signed)
Pt was offered snacks. Graham crackers and Sprite given to patient

## 2018-11-06 NOTE — ED Notes (Signed)
No respiratory distress noted.

## 2018-11-06 NOTE — ED Notes (Signed)
I called patient's daughter, Ophelia Shoulder, and gave her an update on her mother's medical status.  Patient stated that the medicine that I gave her (Morphine) relieved her headache.

## 2018-11-06 NOTE — ED Notes (Signed)
ED Provider at bedside. 

## 2018-11-06 NOTE — ED Provider Notes (Signed)
[facility] EMERGENCY DEPARTMENT Provider Note   CSN: [id number] Arrival date & time: 11/06/18  1808    History   Chief Complaint Chief Complaint  Patient presents with  . Respiratory Distress    HPI Miranda Gates is a 43 y.o. female.     HPI Patient presents to the emergency room for worsening shortness of breath and headache.  Patient has been having symptoms for about a week.  She developed headache then started having a fever.  She began having diffuse body aches.  Patient was seen in the emergency room on June 15.  She had a positive COVID test.  Patient states she has been taking over-the-counter medication such as NyQuil and DayQuil.  She is also tried taking Tylenol.  She continues to have a persistent headache.  She is feeling more short of breath and is having difficulty breathing.  She continues to have fevers.  Patient feels like her symptoms are getting much worse today and she felt like she needed to come back to the emergency room. History reviewed. No pertinent past medical history.  There are no active problems to display for this patient.   Past Surgical History:  Procedure Laterality Date  . CESAREAN SECTION     3 previous  . TUBAL LIGATION       OB History    Gravida  4   Para      Term      Preterm      AB      Living  4     SAB      TAB      Ectopic      Multiple      Live Births               Home Medications    Prior to Admission medications   Not on File    Family History Family History  Problem Relation Age of Onset  . Diabetes Father   . Thyroid disease Sister   . Hypertension Maternal Grandmother   . Hypertension Paternal Grandmother     Social History Social History   Tobacco Use  . Smoking status: Never Smoker  . Smokeless tobacco: Never Used  Substance Use Topics  . Alcohol use: Yes    Comment: rarely  . Drug use: No     Allergies   Patient has no known allergies.    Review of Systems Review of Systems  All other systems reviewed and are negative.    Physical Exam Updated Vital Signs BP 100/60   Pulse 79   Temp 99.5 F (37.5 C) (Oral)   Resp (!) 27   Ht 1.676 m (5\' 6" )   Wt 74.8 kg   LMP 10/23/2018   SpO2 98%   BMI 26.63 kg/m   Physical Exam Vitals signs and nursing note reviewed.  Constitutional:      General: She is not in acute distress.    Appearance: She is well-developed. She is ill-appearing.  HENT:     Head: Normocephalic and atraumatic.     Right Ear: External ear normal.     Left Ear: External ear normal.  Eyes:     General: No scleral icterus.       Right eye: No discharge.        Left eye: No discharge.     Conjunctiva/sclera: Conjunctivae normal.  Neck:     Musculoskeletal: Neck supple.     Trachea: No tracheal deviation.  Cardiovascular:     Rate and Rhythm: Normal rate and regular rhythm.  Pulmonary:     Effort: Pulmonary effort is normal. No respiratory distress.     Breath sounds: Normal breath sounds. No stridor. No wheezing or rales.  Abdominal:     General: Bowel sounds are normal. There is no distension.     Palpations: Abdomen is soft.     Tenderness: There is no abdominal tenderness. There is no guarding or rebound.  Musculoskeletal:        General: No tenderness.  Skin:    General: Skin is warm and dry.     Findings: No rash.  Neurological:     Mental Status: She is alert.     Cranial Nerves: No cranial nerve deficit (no facial droop, extraocular movements intact, no slurred speech).     Sensory: No sensory deficit.     Motor: No abnormal muscle tone or seizure activity.     Coordination: Coordination normal.      ED Treatments / Results  Labs (all labs ordered are listed, but only abnormal results are displayed) Labs Reviewed  CBC WITH DIFFERENTIAL/PLATELET - Abnormal; Notable for the following components:      Result Value   Hemoglobin 11.5 (*)    HCT 35.7 (*)    All other components  within normal limits  COMPREHENSIVE METABOLIC PANEL - Abnormal; Notable for the following components:   Glucose, Bld 117 (*)    Calcium 7.9 (*)    Albumin 2.8 (*)    All other components within normal limits  D-DIMER, QUANTITATIVE (NOT AT [facility]) - Abnormal; Notable for the following components:   D-Dimer, Quant 0.59 (*)    All other components within normal limits  CULTURE, BLOOD (ROUTINE X 2)  CULTURE, BLOOD (ROUTINE X 2)  LACTIC ACID, PLASMA  LACTIC ACID, PLASMA  PROCALCITONIN  LACTATE DEHYDROGENASE  FERRITIN  TRIGLYCERIDES  FIBRINOGEN  C-REACTIVE PROTEIN  PREGNANCY, URINE     Radiology Dg Chest Port 1 View  Result Date: 11/06/2018 CLINICAL DATA:  Dyspnea EXAM: PORTABLE CHEST 1 VIEW COMPARISON:  None. FINDINGS: Low lung volume. Borderline cardiomegaly. Vague peripheral opacity suspected in the left mid lung and bilateral bases. No pneumothorax. IMPRESSION: Suspicion of vague peripheral opacity in the left greater than right lung as may be seen with atypical or viral pneumonia. Electronically Signed   By: Donavan Foil M.D.   On: 11/06/2018 19:51    Procedures Procedures (including critical care time)  Medications Ordered in ED Medications  0.9 %  sodium chloride infusion (1,000 mLs Intravenous New Bag/Given 11/06/18 1905)  morphine 4 MG/ML injection 4 mg (4 mg Intravenous Given 11/06/18 1909)     Initial Impression / Assessment and Plan / ED Course  I have reviewed the triage vital signs and the nursing notes.  Pertinent labs & imaging results that were available during my care of the patient were reviewed by me and considered in my medical decision making (see chart for details).   Patient presented to the emergency room for evaluation of worsening symptoms associated with COVID-19.  Patient was just recently diagnosed and has a positive covid test.  She has had worsening issues with headache fever and shortness of breath.  Here in the ED she did have an oxygen saturation  in the high 80s.  She is now 98% with supplemental nasal cannula at 2 L.  Patient is tachypneic.  She continues to have fever and headache.  X-ray does show probable viral  pneumonia.  I will consult the medical service [facility].  Final Clinical Impressions(s) / ED Diagnoses   Final diagnoses:  Acute respiratory disease due to COVID-19 virus      Linwood DibblesKnapp, Kammie Scioli, MD 11/06/18 2010

## 2018-11-06 NOTE — ED Triage Notes (Signed)
Pt reports she was seen here approx 2 weeks ago and diagnosed with a + COVID test. Today pt started to get worse. Pt c/o severe HA, ShOB, cough, fever, abd pain, diarrhea. Pt noted to have labored breathing.

## 2018-11-07 ENCOUNTER — Encounter (HOSPITAL_COMMUNITY): Payer: Self-pay

## 2018-11-07 DIAGNOSIS — J1289 Other viral pneumonia: Secondary | ICD-10-CM

## 2018-11-07 DIAGNOSIS — U071 COVID-19: Principal | ICD-10-CM

## 2018-11-07 DIAGNOSIS — J069 Acute upper respiratory infection, unspecified: Secondary | ICD-10-CM

## 2018-11-07 MED ORDER — ENOXAPARIN SODIUM 40 MG/0.4ML ~~LOC~~ SOLN
40.0000 mg | Freq: Every day | SUBCUTANEOUS | Status: DC
  Administered 2018-11-07 – 2018-11-12 (×6): 40 mg via SUBCUTANEOUS
  Filled 2018-11-07 (×6): qty 0.4

## 2018-11-07 MED ORDER — NAPHAZOLINE-PHENIRAMINE 0.025-0.3 % OP SOLN
1.0000 [drp] | Freq: Four times a day (QID) | OPHTHALMIC | Status: DC | PRN
  Filled 2018-11-07: qty 5

## 2018-11-07 MED ORDER — OXYCODONE HCL 5 MG PO TABS
5.0000 mg | ORAL_TABLET | ORAL | Status: DC | PRN
  Administered 2018-11-07: 10 mg via ORAL
  Administered 2018-11-07 – 2018-11-08 (×2): 5 mg via ORAL
  Administered 2018-11-08 (×2): 10 mg via ORAL
  Administered 2018-11-09 – 2018-11-11 (×4): 5 mg via ORAL
  Filled 2018-11-07: qty 1
  Filled 2018-11-07 (×2): qty 2
  Filled 2018-11-07 (×2): qty 1
  Filled 2018-11-07: qty 2
  Filled 2018-11-07 (×4): qty 1

## 2018-11-07 MED ORDER — METHYLPREDNISOLONE SODIUM SUCC 40 MG IJ SOLR
40.0000 mg | Freq: Two times a day (BID) | INTRAMUSCULAR | Status: DC
  Administered 2018-11-07 – 2018-11-12 (×9): 40 mg via INTRAVENOUS
  Filled 2018-11-07 (×9): qty 1

## 2018-11-07 MED ORDER — ACETAMINOPHEN 325 MG PO TABS: 650.0000 mg | ORAL_TABLET | Freq: Once | ORAL | Status: DC

## 2018-11-07 MED ORDER — ORAL CARE MOUTH RINSE
15.0000 mL | Freq: Two times a day (BID) | OROMUCOSAL | Status: DC
  Administered 2018-11-07 – 2018-11-11 (×7): 15 mL via OROMUCOSAL

## 2018-11-07 MED ORDER — ACETAMINOPHEN 325 MG PO TABS
650.0000 mg | ORAL_TABLET | ORAL | Status: DC | PRN
  Administered 2018-11-08 – 2018-11-10 (×3): 650 mg via ORAL
  Filled 2018-11-07 (×3): qty 2

## 2018-11-07 MED ORDER — SODIUM CHLORIDE 0.9 % IV SOLN
1000.0000 mL | INTRAVENOUS | Status: DC
  Administered 2018-11-07 – 2018-11-08 (×2): 1000 mL via INTRAVENOUS

## 2018-11-07 MED ORDER — VITAMIN C 500 MG PO TABS
250.0000 mg | ORAL_TABLET | Freq: Every day | ORAL | Status: DC
  Administered 2018-11-07 – 2018-11-12 (×6): 250 mg via ORAL
  Filled 2018-11-07 (×6): qty 1

## 2018-11-07 MED ORDER — ACETAMINOPHEN 325 MG PO TABS
650.0000 mg | ORAL_TABLET | Freq: Once | ORAL | Status: AC
  Administered 2018-11-07: 650 mg via ORAL
  Filled 2018-11-07: qty 2

## 2018-11-07 MED ORDER — TRAMADOL HCL 50 MG PO TABS
50.0000 mg | ORAL_TABLET | Freq: Four times a day (QID) | ORAL | Status: DC | PRN
  Administered 2018-11-07 – 2018-11-09 (×4): 50 mg via ORAL
  Filled 2018-11-07 (×4): qty 1

## 2018-11-07 MED ORDER — KETOROLAC TROMETHAMINE 15 MG/ML IJ SOLN
15.0000 mg | Freq: Once | INTRAMUSCULAR | Status: AC
  Administered 2018-11-07: 15 mg via INTRAVENOUS
  Filled 2018-11-07: qty 1

## 2018-11-07 NOTE — ED Notes (Signed)
Spoke to [employer], pt's daughter and informed her about pt's transfer to the [facility].

## 2018-11-07 NOTE — Progress Notes (Signed)
Patient's daughter, Ophelia Shoulder, called to check on her mother.  The patient does not want to talk to her daughter at this time due to a headache.  Updated daughter on patient's condition.  All questions and concerns addressed.  Encouraged daughter to call at any time if she had any questions.  Earleen Reaper RN

## 2018-11-07 NOTE — ED Notes (Signed)
Report given to [facility] . ETA 20 min for transport.

## 2018-11-07 NOTE — Progress Notes (Signed)
This RN inadvertently validated 0300 VS by mistake while reviewing patient's file prior to her transfer to [place].  Her current primary RN is aware.  Our CN is aware.  Earleen Reaper RN

## 2018-11-07 NOTE — Progress Notes (Addendum)
0900  Arrived to room 109 via EMS.  Vitals stable.  Telemetry placed.  O2 at 3l/[place].  C/o headache 4/10.  States "nothing helps".    2683  C/o headache 4/10.  Tylenol given.  1010  Assisted to [facility].  Stool x 1.  Urine x 1.  O2 sats decreased to 82 with exertion on 3l/[place].  Back to bed safely.  O2 sat increased to 92%  1219  Resting in bed.  C/o headache 6/10.  Ultram given.  Tessalon given for cough.  O2 sats 92% on 3l/[place]  1440  C/o headache 6/10.  Roxicodone given.  O2 sats 95% on 3l/ at this time.

## 2018-11-07 NOTE — ED Notes (Signed)
Ophelia Shoulder (Daughter) [phone]

## 2018-11-07 NOTE — ED Notes (Signed)
pts headache has improved 60%. RN gave patient an Ice pack and cold wash cloth to help with symptom management. Will continue to monitor.

## 2018-11-07 NOTE — Plan of Care (Signed)
  Problem: Education: Goal: Knowledge of General Education information will improve Description Including pain rating scale, medication(s)/side effects and non-pharmacologic comfort measures Outcome: Progressing   Problem: Health Behavior/Discharge Planning: Goal: Ability to manage health-related needs will improve Outcome: Progressing   Problem: Clinical Measurements: Goal: Ability to maintain clinical measurements within normal limits will improve Outcome: Progressing Goal: Diagnostic test results will improve Outcome: Progressing Goal: Respiratory complications will improve Outcome: Progressing Goal: Cardiovascular complication will be avoided Outcome: Progressing   Problem: Activity: Goal: Risk for activity intolerance will decrease Outcome: Progressing   Problem: Nutrition: Goal: Adequate nutrition will be maintained Outcome: Progressing   Problem: Coping: Goal: Level of anxiety will decrease Outcome: Progressing   Problem: Elimination: Goal: Will not experience complications related to bowel motility Outcome: Progressing Goal: Will not experience complications related to urinary retention Outcome: Progressing   Problem: Pain Managment: Goal: General experience of comfort will improve Outcome: Progressing   Problem: Safety: Goal: Ability to remain free from injury will improve Outcome: Progressing   Problem: Skin Integrity: Goal: Risk for impaired skin integrity will decrease Outcome: Progressing   Problem: Education: Goal: Knowledge of risk factors and measures for prevention of condition will improve Outcome: Progressing   Problem: Coping: Goal: Psychosocial and spiritual needs will be supported Outcome: Progressing   Problem: Respiratory: Goal: Will maintain a patent airway Outcome: Progressing Goal: Complications related to the disease process, condition or treatment will be avoided or minimized Outcome: Progressing   

## 2018-11-07 NOTE — H&P (Signed)
History and Physical    Miranda Gates PYK:[id number] DOB: 02-23-1976 DOA: 11/06/2018  PCP: Miranda Bellman, MD Patient coming from: [facility] ED  Chief Complaint: SOB  HPI: 43yo w/ no signif med hx who presented w/ worsening SOB over a week, accompanied by body aches, unrelenting HA, and fever. She was seen in the ED 6/15 and had a positive SARS-CoV-2 test.   In the ED she was found to be tachypneic, mildly hypoxic in the upper 80s on RA, and a CXR noted diffuse pulmonary infiltrates.   Assessment/Plan  COVID Pneumonia steroid tx only for now - follow clinical course - f/u CXR in AM - consider Remdisivir if hypoxia worsens   Recent Labs    11/06/18 1851  DDIMER 0.59*  FERRITIN 144  LDH 201*  CRP 16.9*    Acute Hypoxic Respiratory Failure Due to above - cont supportive O2 as needed to keep sats 88% or >  DVT prophylaxis: lovenox  Code Status: FULL Family Communication: none at point of admit  Disposition Plan: admit to med surg bed at [place]: As per HPI otherwise 10 point review of systems negative.   History reviewed. No pertinent past medical history.  Past Surgical History:  Procedure Laterality Date  . CESAREAN SECTION     3 previous  . TUBAL LIGATION      Family History  Family History  Problem Relation Age of Onset  . Diabetes Father   . Thyroid disease Sister   . Hypertension Maternal Grandmother   . Hypertension Paternal Grandmother     Social History   Lives w/ her husband in [place], long w/ their 3 children. Drinks socially on occasion but not to excess and not frequently. Smokes a very insignificant amount a few times a year (<1 pack per year).   Allergies No Known Allergies  Prior to Admission medications   Not on File    Physical Exam: Vitals:   11/07/18 0443 11/07/18 0842 11/07/18 0845 11/07/18 0900  BP: 102/67 (!) 106/39 (!) 103/59 101/73  Pulse: 80 84 89 85  Resp: (!) 22 18 17 19   Temp:  99.2 F (37.3 C)     TempSrc:  Oral    SpO2: 97% 95% 94% 95%  Weight:  77.1 kg    Height:  5\' 6"  (1.676 m)      Constitutional: NAD, calm, comfortable Eyes: PERRL, lids and conjunctivae normal ENMT: Mucous membranes are moist. Posterior pharynx clear of any exudate or lesions.Normal dentition.  Respiratory: mild diffuse crackles - no wheezing - good air movement th/o   Cardiovascular: Regular rate and rhythm, no murmurs / rubs / gallops. No extremity edema. 2+ pedal pulses. No carotid bruits.  Abdomen: no tenderness, no masses palpated. No hepatosplenomegaly. Bowel sounds positive.  Musculoskeletal: no clubbing / cyanosis. No joint deformity upper and lower extremities. Good ROM, no contractures. Normal muscle tone.  Skin: no rashes, lesions, ulcers. No induration Neurologic: CN 2-12 grossly intact. Sensation intact, DTR normal. Strength 5/5 in all 4.    Labs on Admission:   CBC: Recent Labs  Lab 11/01/18 2058 11/06/18 1851  WBC 8.0 5.2  NEUTROABS 6.9 4.3  HGB 12.5 11.5*  HCT 39.9 35.7*  MCV 87.9 86.4  PLT 223 539   Basic Metabolic Panel: Recent Labs  Lab 11/01/18 2058 11/06/18 1851  NA 135 138  K 3.8 3.5  CL 103 101  CO2 24 26  GLUCOSE 133* 117*  BUN 6 9  CREATININE  0.79 0.52  CALCIUM 8.3* 7.9*   GFR: Estimated Creatinine Clearance: 94.1 mL/min (by C-G formula based on SCr of 0.52 mg/dL).   Liver Function Tests: Recent Labs  Lab 11/06/18 1851  AST 38  ALT 31  ALKPHOS 54  BILITOT 0.4  PROT 6.5  ALBUMIN 2.8*   Lipid Profile: Recent Labs    11/06/18 1851  TRIG 158*   Anemia Panel: Recent Labs    11/06/18 1851  FERRITIN 144   Urine analysis:    Component Value Date/Time   COLORURINE YELLOW 11/01/2018 2058   APPEARANCEUR CLEAR 11/01/2018 2058   LABSPEC 1.010 11/01/2018 2058   PHURINE 7.5 11/01/2018 2058   GLUCOSEU NEGATIVE 11/01/2018 2058   HGBUR NEGATIVE 11/01/2018 2058   BILIRUBINUR NEGATIVE 11/01/2018 2058   KETONESUR NEGATIVE 11/01/2018 2058   PROTEINUR  NEGATIVE 11/01/2018 2058   NITRITE NEGATIVE 11/01/2018 2058   LEUKOCYTESUR TRACE (A) 11/01/2018 2058    Recent Results (from the past 240 hour(s))  SARS Coronavirus 2 (Hosp order,Performed in [facility] lab via Abbott ID)     Status: Abnormal   Collection Time: 11/01/18 10:58 PM   Specimen: Dry Nasal Swab (Abbott ID Now)  Result Value Ref Range Status   SARS Coronavirus 2 (Abbott ID Now) POSITIVE (A) NEGATIVE Final    Comment: RESULT CALLED TO, READ BACK BY AND VERIFIED WITH: ADKINS,L,RN @ 2318 11/01/18 BY GWYN,P (NOTE) Interpretive Result Comment(s): COVID 19 Positive SARS CoV 2 target nucleic acids are DETECTED. The SARS CoV 2 RNA is generally detectable in upper and lower respiratory specimens during the acute phase of infection.  Positive results are indicative of active infection with SARS CoV 2.  Clinical correlation with patient history and other diagnostic information is necessary to determine patient infection status.  Positive results do not rule out bacterial infection or coinfection with other viruses. The expected result is Negative. COVID 19 Negative SARS CoV 2 target nucleic acids are NOT DETECTED. The SARS CoV 2 RNA is generally detectable in upper and lower respiratory specimens during the acute phase of infection.  Negative results do not preclude SARS CoV 2 infection, do not rule out coinfections with other pathogens, and should not be used as the sole basis for treatment  or other patient management decisions.  Negative results must be combined with clinical observations, patient history, and epidemiological information. The expected result is Negative. Invalid Presence or absence of SARS CoV 2 nucleic acids cannot be determined. Repeat testing was performed on the submitted specimen and repeated Invalid results were obtained.  If clinically indicated, additional testing on a new specimen with an alternate test methodology [phone]) is advised.  The SARS  CoV 2 RNA is generally detectable in upper and lower respiratory specimens during the acute phase of infection. The expected result is Negative. Fact Sheet for Patients:  http://www.graves-ford.org/https://www.fda.gov/media/136524/download Fact Sheet for Healthcare Providers: EnviroConcern.sihttps://www.fda.gov/media/136523/download This test is not yet approved or cleared by the [place] FDA and has been authorized for detection and/or diagnosis of SARS CoV 2 by FDA under an Emergency Use Authorization (EUA).  This EUA  will remain in effect (meaning this test can be used) for the duration of the COVID19 declaration under Section 564(b)(1) of the Act, 21 U.S.C. section [phone] 3(b)(1), unless the authorization is terminated or revoked sooner. Performed at [facility] [place], [address] [place], [place] [address]   Group A Strep by PCR     Status: None   Collection Time: 11/01/18 11:18 PM  Specimen: Throat; Sterile Swab  Result Value Ref Range Status   Group A Strep by PCR NOT DETECTED NOT DETECTED Final    Comment: Performed at [facility] [place], [address] [place], [place] [address]     Radiological Exams on Admission: [facility]  Result Date: 11/06/2018 CLINICAL DATA:  Dyspnea EXAM: PORTABLE CHEST 1 VIEW COMPARISON:  None. FINDINGS: Low lung volume. Borderline cardiomegaly. Vague peripheral opacity suspected in the left mid lung and bilateral bases. No pneumothorax. IMPRESSION: Suspicion of vague peripheral opacity in the left greater than right lung as may be seen with atypical or viral pneumonia. Electronically Signed   By: Jasmine PangKim  Fujinaga M.D.   On: 11/06/2018 19:51    Lonia BloodJeffrey T. McClung, MD [facility] Office  [phone] Pager - Text Page per Amion as per below:  On-Call/Text Page:      [email or web address]  If 7PM-7AM, please contact night-coverage [email or web address] 11/07/2018, 11:22 AM

## 2018-11-08 ENCOUNTER — Inpatient Hospital Stay (HOSPITAL_COMMUNITY): Payer: HRSA Program

## 2018-11-08 LAB — COMPREHENSIVE METABOLIC PANEL
ALT: 30 U/L (ref 0–44)
AST: 36 U/L (ref 15–41)
Albumin: 2.8 g/dL — ABNORMAL LOW (ref 3.5–5.0)
Alkaline Phosphatase: 62 U/L (ref 38–126)
Anion gap: 12 (ref 5–15)
BUN: 7 mg/dL (ref 6–20)
CO2: 20 mmol/L — ABNORMAL LOW (ref 22–32)
Calcium: 8.1 mg/dL — ABNORMAL LOW (ref 8.9–10.3)
Chloride: 105 mmol/L (ref 98–111)
Creatinine, Ser: 0.49 mg/dL (ref 0.44–1.00)
GFR calc Af Amer: >60 mL/min (ref >60)
GFR calc non Af Amer: >60 mL/min (ref >60)
Glucose, Bld: 139 mg/dL — ABNORMAL HIGH (ref 70–99)
Potassium: 3.6 mmol/L (ref 3.5–5.1)
Sodium: 137 mmol/L (ref 135–145)
Total Bilirubin: 0.2 mg/dL — ABNORMAL LOW (ref 0.3–1.2)
Total Protein: 6.9 g/dL (ref 6.5–8.1)

## 2018-11-08 LAB — CBC WITH DIFFERENTIAL/PLATELET
Abs Immature Granulocytes: 0.02 10*3/uL (ref 0.00–0.07)
Basophils Absolute: 0 10*3/uL (ref 0.0–0.1)
Basophils Relative: 0 %
Eosinophils Absolute: 0 10*3/uL (ref 0.0–0.5)
Eosinophils Relative: 0 %
HCT: 36.9 % (ref 36.0–46.0)
Hemoglobin: 12 g/dL (ref 12.0–15.0)
Immature Granulocytes: 0 %
Lymphocytes Relative: 11 %
Lymphs Abs: 0.6 10*3/uL — ABNORMAL LOW (ref 0.7–4.0)
MCH: 28.1 pg (ref 26.0–34.0)
MCHC: 32.5 g/dL (ref 30.0–36.0)
MCV: 86.4 fL (ref 80.0–100.0)
Monocytes Absolute: 0.1 10*3/uL (ref 0.1–1.0)
Monocytes Relative: 2 %
Neutro Abs: 5 10*3/uL (ref 1.7–7.7)
Neutrophils Relative %: 87 %
Platelets: 250 10*3/uL (ref 150–400)
RBC: 4.27 MIL/uL (ref 3.87–5.11)
RDW: 13.4 % (ref 11.5–15.5)
WBC: 5.8 10*3/uL (ref 4.0–10.5)
nRBC: 0 % (ref 0.0–0.2)

## 2018-11-08 LAB — D-DIMER, QUANTITATIVE: D-Dimer, Quant: 0.9 ug/mL-FEU — ABNORMAL HIGH (ref 0.00–0.50)

## 2018-11-08 LAB — C-REACTIVE PROTEIN: CRP: 23.2 mg/dL — ABNORMAL HIGH (ref <1.0)

## 2018-11-08 LAB — FERRITIN: Ferritin: 177 ng/mL (ref 11–307)

## 2018-11-08 MED ORDER — SODIUM CHLORIDE 0.9 % IV SOLN
100.0000 mg | INTRAVENOUS | Status: AC
  Administered 2018-11-09 – 2018-11-12 (×4): 100 mg via INTRAVENOUS
  Filled 2018-11-08 (×4): qty 20

## 2018-11-08 MED ORDER — SODIUM CHLORIDE 0.9 % IV SOLN
200.0000 mg | Freq: Once | INTRAVENOUS | Status: AC
  Administered 2018-11-08: 200 mg via INTRAVENOUS
  Filled 2018-11-08: qty 40

## 2018-11-08 MED ORDER — SUMATRIPTAN SUCCINATE 25 MG PO TABS
25.0000 mg | ORAL_TABLET | ORAL | Status: DC | PRN
  Filled 2018-11-08: qty 1

## 2018-11-08 MED ORDER — PROMETHAZINE HCL 25 MG/ML IJ SOLN
12.5000 mg | Freq: Four times a day (QID) | INTRAMUSCULAR | Status: DC | PRN
  Administered 2018-11-12: 12.5 mg via INTRAVENOUS
  Filled 2018-11-08: qty 1

## 2018-11-08 MED ORDER — ONDANSETRON HCL 4 MG/2ML IJ SOLN
4.0000 mg | Freq: Four times a day (QID) | INTRAMUSCULAR | Status: DC | PRN
  Administered 2018-11-08 – 2018-11-11 (×2): 4 mg via INTRAVENOUS
  Filled 2018-11-08 (×2): qty 2

## 2018-11-08 NOTE — Progress Notes (Signed)
Encouraged patient to ambulate to bathroom and to sit up in chair this morning. Patient was reluctant. We discussed the importance of mobilizing. Patient agreed to get up around lunch time due to not having any sleep last night.

## 2018-11-08 NOTE — Progress Notes (Signed)
TEAM 1 - Stepdown/ICU TEAM  Marsa ArisEdith Delagarza Linarez  GLO:[id number]:[id number] DOB: 11/03/1975 DOA: 11/06/2018 PCP: Catalina Antiguaonstant, Peggy, MD    Brief Narrative:  18AC:  42yo w/ no signif med hx who presented w/ worsening SOB over a week, accompanied by body aches, unrelenting HA, and fever. She was seen in the ED 6/15 and had a positive SARS-CoV-2 test.   In the ED she was found to be tachypneic, mildly hypoxic in the upper 80s on RA, and a CXR noted diffuse pulmonary infiltrates.   Significant Events: 6/21 admit   COVID-19 specific Treatment: Steroids 6/21 > Remdesivir 6/22 >  Subjective: Reports that she feels more SOB today. Is very glad her HA has resolved for now. Feels weak. Denies cp, n/v, or abdom pain. O2 requirement has increased.   Assessment & Plan:  COVID Pneumonia W/ increased O2 needs and worsening CXR I spoke to her about Remdesivir and she agreed to begin dosing - cont steroids   Recent Labs    11/06/18 1851 11/08/18 0407  DDIMER 0.59* 0.90*  FERRITIN 144 177  LDH 201*  --   CRP 16.9* 23.2*    Acute Hypoxic Respiratory Failure Due to above - cont supportive O2 as needed to keep sats 88% or >  Intractable HA Likely due [facility] - has responded well to IVF and pain meds - follow - did have CT head 6/15 which was unremarkable   DVT prophylaxis: lovenx Code Status: FULL CODE Family Communication:  Disposition Plan:   Consultants:  none  Antimicrobials:  none  Objective: Blood pressure 114/64, pulse 89, temperature 98.5 F (36.9 C), temperature source Oral, resp. rate (!) 35, height 5\' 6"  (1.676 m), weight 77.1 kg, last menstrual period 10/23/2018, SpO2 93 %.  Intake/Output Summary (Last 24 hours) at 11/08/2018 0902 Last data filed at 11/08/2018 0600 Gross per 24 hour  Intake 3178.23 ml  Output 0 ml  Net 3178.23 ml   Filed Weights   11/06/18 1824 11/07/18 0842  Weight: 74.8 kg 77.1 kg    Examination: General: requiring [place] at 3L - some dyspnea  w/ conversation  Lungs: diffuse fine crackles - no wheezing  Cardiovascular: RRR - no M or rub  Abdomen: NT/ND, soft, bs+, no mass  Extremities: No significant C/C/E B LE   CBC: Recent Labs  Lab 11/01/18 2058 11/06/18 1851 11/08/18 0407  WBC 8.0 5.2 5.8  NEUTROABS 6.9 4.3 5.0  HGB 12.5 11.5* 12.0  HCT 39.9 35.7* 36.9  MCV 87.9 86.4 86.4  PLT 223 197 250   Basic Metabolic Panel: Recent Labs  Lab 11/01/18 2058 11/06/18 1851 11/08/18 0407  NA 135 138 137  K 3.8 3.5 3.6  CL 103 101 105  CO2 24 26 20*  GLUCOSE 133* 117* 139*  BUN 6 9 7   CREATININE 0.79 0.52 0.49  CALCIUM 8.3* 7.9* 8.1*   GFR: Estimated Creatinine Clearance: 94.1 mL/min (by C-G formula based on SCr of 0.49 mg/dL).  Liver Function Tests: Recent Labs  Lab 11/06/18 1851 11/08/18 0407  AST 38 36  ALT 31 30  ALKPHOS 54 62  BILITOT 0.4 0.2*  PROT 6.5 6.9  ALBUMIN 2.8* 2.8*    HbA1C: Hemoglobin A1c  Date/Time Value Ref Range Status  11/15/2015 10:23 AM 5.4 4.8 - 5.6 % Final    Comment:             Pre-diabetes: 5.7 - 6.4          Diabetes: >6.4  Glycemic control for adults with diabetes: <7.0      Recent Results (from the past 240 hour(s))  SARS Coronavirus 2 (Hosp order,Performed in [facility] lab via Abbott ID)     Status: Abnormal   Collection Time: 11/01/18 10:58 PM   Specimen: Dry Nasal Swab (Abbott ID Now)  Result Value Ref Range Status   SARS Coronavirus 2 (Abbott ID Now) POSITIVE (A) NEGATIVE Final    Comment: RESULT CALLED TO, READ BACK BY AND VERIFIED WITH: ADKINS,L,RN @ 2318 11/01/18 BY GWYN,P (NOTE) Interpretive Result Comment(s): COVID 19 Positive SARS CoV 2 target nucleic acids are DETECTED. The SARS CoV 2 RNA is generally detectable in upper and lower respiratory specimens during the acute phase of infection.  Positive results are indicative of active infection with SARS CoV 2.  Clinical correlation with patient history and other diagnostic information is  necessary to determine patient infection status.  Positive results do not rule out bacterial infection or coinfection with other viruses. The expected result is Negative. COVID 19 Negative SARS CoV 2 target nucleic acids are NOT DETECTED. The SARS CoV 2 RNA is generally detectable in upper and lower respiratory specimens during the acute phase of infection.  Negative results do not preclude SARS CoV 2 infection, do not rule out coinfections with other pathogens, and should not be used as the sole basis for treatment  or other patient management decisions.  Negative results must be combined with clinical observations, patient history, and epidemiological information. The expected result is Negative. Invalid Presence or absence of SARS CoV 2 nucleic acids cannot be determined. Repeat testing was performed on the submitted specimen and repeated Invalid results were obtained.  If clinically indicated, additional testing on a new specimen with an alternate test methodology [phone]([id number]) is advised.  The SARS CoV 2 RNA is generally detectable in upper and lower respiratory specimens during the acute phase of infection. The expected result is Negative. Fact Sheet for Patients:  http://www.graves-ford.org/https://www.fda.gov/media/136524/download Fact Sheet for Healthcare Providers: EnviroConcern.sihttps://www.fda.gov/media/136523/download This test is not yet approved or cleared by the [place] FDA and has been authorized for detection and/or diagnosis of SARS CoV 2 by FDA under an Emergency Use Authorization (EUA).  This EUA  will remain in effect (meaning this test can be used) for the duration of the COVID19 declaration under Section 564(b)(1) of the Act, 21 U.S.C. section [phone] 3(b)(1), unless the authorization is terminated or revoked sooner. Performed at [facility] [place], [address] [place], [place] [address]   Group A Strep by PCR     Status: None   Collection Time: 11/01/18 11:18 PM   Specimen: Throat;  Sterile Swab  Result Value Ref Range Status   Group A Strep by PCR NOT DETECTED NOT DETECTED Final    Comment: Performed at [facility] [place], [address] [place], [place] [address]  Blood Culture (routine x 2)     Status: None (Preliminary result)   Collection Time: 11/06/18  6:51 PM   Specimen: BLOOD LEFT ARM  Result Value Ref Range Status   Specimen Description   Final    BLOOD LEFT ARM Performed at [facility] [place], [address] [place], [place] [address]    Special Requests   Final    BOTTLES DRAWN AEROBIC AND ANAEROBIC Blood Culture adequate volume Performed at [facility] [place], [address] [place], [place] [address]    Culture   Final    NO  GROWTH 2 DAYS Performed at [facility], [place] [address], [place], [place] [address]    Report Status PENDING  Incomplete  Blood Culture (routine x 2)     Status: None (Preliminary result)   Collection Time: 11/06/18  7:20 PM   Specimen: BLOOD RIGHT ARM  Result Value Ref Range Status   Specimen Description   Final    BLOOD RIGHT ARM Performed at [facility], [address] [place], [place] [address]    Special Requests   Final    BOTTLES DRAWN AEROBIC AND ANAEROBIC Blood Culture adequate volume Performed at [facility], [address] [place], [place] [address]    Culture   Final    NO GROWTH 2 DAYS Performed at [facility], [place] [address] [place], [place] [address]    Report Status PENDING  Incomplete     Scheduled Meds: . enoxaparin (LOVENOX) injection  40 mg Subcutaneous Daily  . mouth rinse  15 mL Mouth Rinse BID  . methylPREDNISolone (SOLU-MEDROL) injection  40 mg Intravenous Q12H  . vitamin C  250 mg Oral Daily   Continuous Infusions: . sodium chloride 1,000 mL (11/08/18 0427)     LOS: 2 days   Cherene Altes, MD [facility] Office  [phone] Pager - Text Page per Shea Evans  If 7PM-7AM, please contact night-coverage per Amion  11/08/2018, 9:02 AM

## 2018-11-09 LAB — CBC WITH DIFFERENTIAL/PLATELET
Abs Immature Granulocytes: 0.07 10*3/uL (ref 0.00–0.07)
Basophils Absolute: 0 10*3/uL (ref 0.0–0.1)
Basophils Relative: 0 %
Eosinophils Absolute: 0 10*3/uL (ref 0.0–0.5)
Eosinophils Relative: 0 %
HCT: 37.4 % (ref 36.0–46.0)
Hemoglobin: 12.1 g/dL (ref 12.0–15.0)
Immature Granulocytes: 1 %
Lymphocytes Relative: 7 %
Lymphs Abs: 0.5 10*3/uL — ABNORMAL LOW (ref 0.7–4.0)
MCH: 27.9 pg (ref 26.0–34.0)
MCHC: 32.4 g/dL (ref 30.0–36.0)
MCV: 86.4 fL (ref 80.0–100.0)
Monocytes Absolute: 0.3 10*3/uL (ref 0.1–1.0)
Monocytes Relative: 4 %
Neutro Abs: 7.4 10*3/uL (ref 1.7–7.7)
Neutrophils Relative %: 88 %
Platelets: 296 10*3/uL (ref 150–400)
RBC: 4.33 MIL/uL (ref 3.87–5.11)
RDW: 13.2 % (ref 11.5–15.5)
WBC: 8.4 10*3/uL (ref 4.0–10.5)
nRBC: 0 % (ref 0.0–0.2)

## 2018-11-09 LAB — COMPREHENSIVE METABOLIC PANEL
ALT: 89 U/L — ABNORMAL HIGH (ref 0–44)
AST: 95 U/L — ABNORMAL HIGH (ref 15–41)
Albumin: 2.7 g/dL — ABNORMAL LOW (ref 3.5–5.0)
Alkaline Phosphatase: 62 U/L (ref 38–126)
Anion gap: 11 (ref 5–15)
BUN: 14 mg/dL (ref 6–20)
CO2: 23 mmol/L (ref 22–32)
Calcium: 8.6 mg/dL — ABNORMAL LOW (ref 8.9–10.3)
Chloride: 106 mmol/L (ref 98–111)
Creatinine, Ser: 0.49 mg/dL (ref 0.44–1.00)
GFR calc Af Amer: >60 mL/min (ref >60)
GFR calc non Af Amer: >60 mL/min (ref >60)
Glucose, Bld: 145 mg/dL — ABNORMAL HIGH (ref 70–99)
Potassium: 4.1 mmol/L (ref 3.5–5.1)
Sodium: 140 mmol/L (ref 135–145)
Total Bilirubin: 0.2 mg/dL — ABNORMAL LOW (ref 0.3–1.2)
Total Protein: 6.6 g/dL (ref 6.5–8.1)

## 2018-11-09 LAB — D-DIMER, QUANTITATIVE: D-Dimer, Quant: 0.67 ug/mL-FEU — ABNORMAL HIGH (ref 0.00–0.50)

## 2018-11-09 LAB — FERRITIN: Ferritin: 302 ng/mL (ref 11–307)

## 2018-11-09 LAB — C-REACTIVE PROTEIN: CRP: 10.2 mg/dL — ABNORMAL HIGH (ref <1.0)

## 2018-11-09 LAB — HIV ANTIBODY (ROUTINE TESTING W REFLEX): HIV Screen 4th Generation wRfx: NONREACTIVE

## 2018-11-09 MED ORDER — ZOLPIDEM TARTRATE 5 MG PO TABS
5.0000 mg | ORAL_TABLET | Freq: Every day | ORAL | Status: DC
  Administered 2018-11-09 – 2018-11-11 (×3): 5 mg via ORAL
  Filled 2018-11-09 (×5): qty 1

## 2018-11-09 MED ORDER — ZOLPIDEM TARTRATE 5 MG PO TABS: 5.0000 mg | ORAL_TABLET | Freq: Every day | ORAL | Status: DC

## 2018-11-09 NOTE — Progress Notes (Signed)
[facility] TEAM 1 - Stepdown/ICU TEAM  Miranda Gates  ZOX:[id number] DOB: 04/18/1976 DOA: 11/06/2018 PCP: Catalina Antiguaonstant, Peggy, MD    Brief Narrative:  81XB:  42yo w/ no signif med hx who presented w/ worsening SOB over a week, accompanied by body aches, unrelenting HA, and fever. She was seen in the ED 6/15 and had a positive SARS-CoV-2 test.   In the ED she was found to be tachypneic, mildly hypoxic in the upper 80s on RA, and a CXR noted diffuse pulmonary infiltrates.   Significant Events: 6/21 admit   COVID-19 specific Treatment: Steroids 6/21 > Remdesivir 6/22 >  Subjective: Continues to require significant oxygen support.  LFTs increased today.  Spoke with patient using the iPad interpreter.  She states she feels much better overall.  She no longer has a headache.  She remains very short of breath when exerting herself.  She is waking up in the middle the night due to difficulty breathing.  Assessment & Plan:  COVID Pneumonia Continue Remdesivir - cont steroids -wean oxygen as able -monitor LFTs closely  Recent Labs    11/06/18 1851 11/08/18 0407 11/09/18 0320  DDIMER 0.59* 0.90* 0.67*  FERRITIN 144 177 302  LDH 201*  --   --   CRP 16.9* 23.2* 10.2*   Mild Transaminitis Coincident with Remdesivir effusion -follow trend -not yet high enough to prevent further dosing  Recent Labs  Lab 11/06/18 1851 11/08/18 0407 11/09/18 0320  AST 38 36 95*  ALT 31 30 89*  ALKPHOS 54 62 62  BILITOT 0.4 0.2* 0.2*  PROT 6.5 6.9 6.6  ALBUMIN 2.8* 2.8* 2.7*     Acute Hypoxic Respiratory Failure Due to above - cont supportive O2 as needed to keep sats 88% or >  Intractable HA Likely [facility] - [facility] head 6/15 which was unremarkable - now resolved   DVT prophylaxis: lovenx Code Status: FULL CODE Family Communication:  Disposition Plan:   Consultants:  none  Antimicrobials:  none  Objective: Blood pressure 113/70, pulse 91, temperature 97.7 F (36.5 C),  temperature source Oral, resp. rate (!) 35, height 5\' 6"  (1.676 m), weight 77.1 kg, last menstrual period 10/23/2018, SpO2 93 %.  Intake/Output Summary (Last 24 hours) at 11/09/2018 0839 Last data filed at 11/09/2018 0600 Gross per 24 hour  Intake 1320 ml  Output 2300 ml  Net -980 ml   Filed Weights   11/06/18 1824 11/07/18 0842  Weight: 74.8 kg 77.1 kg    Examination: General: requiring [place] at 5L -able to complete full sentences Lungs: diffuse fine crackles without significant change Cardiovascular: RRR without murmur or rub Abdomen: NT/ND, soft, bs+, no mass  Extremities: No C/C/E B LE   CBC: Recent Labs  Lab 11/06/18 1851 11/08/18 0407 11/09/18 0320  WBC 5.2 5.8 8.4  NEUTROABS 4.3 5.0 7.4  HGB 11.5* 12.0 12.1  HCT 35.7* 36.9 37.4  MCV 86.4 86.4 86.4  PLT 197 250 296   Basic Metabolic Panel: Recent Labs  Lab 11/06/18 1851 11/08/18 0407 11/09/18 0320  NA 138 137 140  K 3.5 3.6 4.1  CL 101 105 106  CO2 26 20* 23  GLUCOSE 117* 139* 145*  BUN 9 7 14   CREATININE 0.52 0.49 0.49  CALCIUM 7.9* 8.1* 8.6*   GFR: Estimated Creatinine Clearance: 94.1 mL/min (by C-G formula based on SCr of 0.49 mg/dL).  Liver Function Tests: Recent Labs  Lab 11/06/18 1851 11/08/18 0407 11/09/18 0320  AST 38 36 95*  ALT  31 30 89*  ALKPHOS 54 62 62  BILITOT 0.4 0.2* 0.2*  PROT 6.5 6.9 6.6  ALBUMIN 2.8* 2.8* 2.7*    HbA1C: Hemoglobin A1c  Date/Time Value Ref Range Status  11/15/2015 10:23 AM 5.4 4.8 - 5.6 % Final    Comment:             Pre-diabetes: 5.7 - 6.4          Diabetes: >6.4          Glycemic control for adults with diabetes: <7.0      Recent Results (from the past 240 hour(s))  SARS Coronavirus 2 (Hosp order,Performed in [place] lab via Abbott ID)     Status: Abnormal   Collection Time: 11/01/18 10:58 PM   Specimen: Dry Nasal Swab (Abbott ID Now)  Result Value Ref Range Status   SARS Coronavirus 2 (Abbott ID Now) POSITIVE (A) NEGATIVE Final    Comment:  RESULT CALLED TO, READ BACK BY AND VERIFIED WITH: ADKINS,L,RN @ 2318 11/01/18 BY GWYN,P (NOTE) Interpretive Result Comment(s): COVID 19 Positive SARS CoV 2 target nucleic acids are DETECTED. The SARS CoV 2 RNA is generally detectable in upper and lower respiratory specimens during the acute phase of infection.  Positive results are indicative of active infection with SARS CoV 2.  Clinical correlation with patient history and other diagnostic information is necessary to determine patient infection status.  Positive results do not rule out bacterial infection or coinfection with other viruses. The expected result is Negative. COVID 19 Negative SARS CoV 2 target nucleic acids are NOT DETECTED. The SARS CoV 2 RNA is generally detectable in upper and lower respiratory specimens during the acute phase of infection.  Negative results do not preclude SARS CoV 2 infection, do not rule out coinfections with other pathogens, and should not be used as the sole basis for treatment  or other patient management decisions.  Negative results must be combined with clinical observations, patient history, and epidemiological information. The expected result is Negative. Invalid Presence or absence of SARS CoV 2 nucleic acids cannot be determined. Repeat testing was performed on the submitted specimen and repeated Invalid results were obtained.  If clinically indicated, additional testing on a new specimen with an alternate test methodology [phone]) is advised.  The SARS CoV 2 RNA is generally detectable in upper and lower respiratory specimens during the acute phase of infection. The expected result is Negative. Fact Sheet for Patients:  GolfingFamily.no Fact Sheet for Healthcare Providers: https://www.hernandez-brewer.com/ This test is not yet approved or cleared by the [place] FDA and has been authorized for detection and/or diagnosis of SARS CoV 2 by FDA  under an Emergency Use Authorization (EUA).  This EUA  will remain in effect (meaning this test can be used) for the duration of the COVID19 declaration under Section 564(b)(1) of the Act, 21 U.S.C. section [phone] 3(b)(1), unless the authorization is terminated or revoked sooner. Performed at [facility], [address] [place], [place] [address]   Group A Strep by PCR     Status: None   Collection Time: 11/01/18 11:18 PM   Specimen: Throat; Sterile Swab  Result Value Ref Range Status   Group A Strep by PCR NOT DETECTED NOT DETECTED Final    Comment: Performed at [facility], [address] [place], [place] [address]  Blood Culture (routine x 2)     Status: None (Preliminary result)   Collection Time: 11/06/18  6:51  PM   Specimen: BLOOD LEFT ARM  Result Value Ref Range Status   Specimen Description   Final    BLOOD LEFT ARM Performed at [facility] [place], [address] [place], [place] [address]    Special Requests   Final    BOTTLES DRAWN AEROBIC AND ANAEROBIC Blood Culture adequate volume Performed at [facility] [place], [address] [place], [place] [address]    Culture   Final    NO GROWTH 2 DAYS Performed at [facility], [address] [place], [place] [address]    Report Status PENDING  Incomplete  Blood Culture (routine x 2)     Status: None (Preliminary result)   Collection Time: 11/06/18  7:20 PM   Specimen: BLOOD RIGHT ARM  Result Value Ref Range Status   Specimen Description   Final    BLOOD RIGHT ARM Performed at [facility] [place], [address] [place], [place] [address]    Special Requests   Final    BOTTLES DRAWN AEROBIC AND ANAEROBIC Blood Culture adequate volume Performed at [facility] [place], [address] [place], [place] [address]    Culture   Final    NO GROWTH 2 DAYS Performed at [facility], [address] [place], [place] [address]    Report Status PENDING  Incomplete      Scheduled Meds: . enoxaparin (LOVENOX) injection  40 mg Subcutaneous Daily  . mouth rinse  15 mL Mouth Rinse BID  . methylPREDNISolone (SOLU-MEDROL) injection  40 mg Intravenous Q12H  . vitamin C  250 mg Oral Daily   Continuous Infusions: . remdesivir 100 mg in NS 250 mL       LOS: 3 days   Lonia BloodJeffrey T. Brighten Buzzelli, MD [facility] Office  [phone] Pager - Text Page per Loretha StaplerAmion  If 7PM-7AM, please contact night-coverage per Amion 11/09/2018, 8:39 AM

## 2018-11-10 LAB — CBC WITH DIFFERENTIAL/PLATELET
Abs Immature Granulocytes: 0.16 10*3/uL — ABNORMAL HIGH (ref 0.00–0.07)
Basophils Absolute: 0 10*3/uL (ref 0.0–0.1)
Basophils Relative: 0 %
Eosinophils Absolute: 0 10*3/uL (ref 0.0–0.5)
Eosinophils Relative: 0 %
HCT: 37.8 % (ref 36.0–46.0)
Hemoglobin: 12.1 g/dL (ref 12.0–15.0)
Immature Granulocytes: 2 %
Lymphocytes Relative: 10 %
Lymphs Abs: 0.8 10*3/uL (ref 0.7–4.0)
MCH: 27.6 pg (ref 26.0–34.0)
MCHC: 32 g/dL (ref 30.0–36.0)
MCV: 86.3 fL (ref 80.0–100.0)
Monocytes Absolute: 0.5 10*3/uL (ref 0.1–1.0)
Monocytes Relative: 6 %
Neutro Abs: 6.6 10*3/uL (ref 1.7–7.7)
Neutrophils Relative %: 82 %
Platelets: 396 10*3/uL (ref 150–400)
RBC: 4.38 MIL/uL (ref 3.87–5.11)
RDW: 13.1 % (ref 11.5–15.5)
WBC: 8 10*3/uL (ref 4.0–10.5)
nRBC: 0 % (ref 0.0–0.2)

## 2018-11-10 LAB — COMPREHENSIVE METABOLIC PANEL
ALT: 201 U/L — ABNORMAL HIGH (ref 0–44)
AST: 140 U/L — ABNORMAL HIGH (ref 15–41)
Albumin: 2.9 g/dL — ABNORMAL LOW (ref 3.5–5.0)
Alkaline Phosphatase: 68 U/L (ref 38–126)
Anion gap: 14 (ref 5–15)
BUN: 17 mg/dL (ref 6–20)
CO2: 20 mmol/L — ABNORMAL LOW (ref 22–32)
Calcium: 8.5 mg/dL — ABNORMAL LOW (ref 8.9–10.3)
Chloride: 104 mmol/L (ref 98–111)
Creatinine, Ser: 0.54 mg/dL (ref 0.44–1.00)
GFR calc Af Amer: >60 mL/min (ref >60)
GFR calc non Af Amer: >60 mL/min (ref >60)
Glucose, Bld: 101 mg/dL — ABNORMAL HIGH (ref 70–99)
Potassium: 4.1 mmol/L (ref 3.5–5.1)
Sodium: 138 mmol/L (ref 135–145)
Total Bilirubin: 0.2 mg/dL — ABNORMAL LOW (ref 0.3–1.2)
Total Protein: 6.9 g/dL (ref 6.5–8.1)

## 2018-11-10 LAB — FERRITIN: Ferritin: 408 ng/mL — ABNORMAL HIGH (ref 11–307)

## 2018-11-10 LAB — D-DIMER, QUANTITATIVE: D-Dimer, Quant: 0.83 ug/mL-FEU — ABNORMAL HIGH (ref 0.00–0.50)

## 2018-11-10 LAB — C-REACTIVE PROTEIN: CRP: 3.7 mg/dL — ABNORMAL HIGH (ref <1.0)

## 2018-11-10 MED ORDER — LIP MEDEX EX OINT
TOPICAL_OINTMENT | CUTANEOUS | Status: DC | PRN
  Administered 2018-11-10: 15:00:00 via TOPICAL
  Filled 2018-11-10: qty 7

## 2018-11-10 MED ORDER — FAMOTIDINE 20 MG PO TABS
20.0000 mg | ORAL_TABLET | Freq: Every day | ORAL | Status: DC
  Administered 2018-11-10 – 2018-11-12 (×3): 20 mg via ORAL
  Filled 2018-11-10 (×3): qty 1

## 2018-11-10 MED ORDER — ALPRAZOLAM 0.5 MG PO TABS: 0.2500 mg | ORAL_TABLET | Freq: Three times a day (TID) | ORAL | Status: DC | PRN

## 2018-11-10 MED ORDER — KETOROLAC TROMETHAMINE 30 MG/ML IJ SOLN
30.0000 mg | Freq: Four times a day (QID) | INTRAMUSCULAR | Status: DC | PRN
  Administered 2018-11-10 – 2018-11-11 (×2): 30 mg via INTRAVENOUS
  Filled 2018-11-10 (×3): qty 1

## 2018-11-10 NOTE — Progress Notes (Signed)
PROGRESS NOTE                                                                                                                                                                                                             Patient Demographics:    Miranda Gates, is a 43 y.o. female, DOB - 05/12/1976, UJW:[id number]  Outpatient Primary MD for the patient is Constant, Gigi GinPeggy, MD    LOS - 4  Chief Complaint  Patient presents with  . Respiratory Distress       Brief Narrative: Patient is a 43 y.o. female with no significant PMHx presented with acute hypoxic respiratory failure due to COVID-19 viral pneumonia.  See below for further details   Subjective:    Miranda Gates today denies any headache-shortness of breath improved.  Seems slightly anxious-claims that her daughter tested positive for COVID-19 yesterday.   Assessment  & Plan :   Acute Hypoxic Resp Failure due to Covid 19 Viral pneumonia: Improving-continue at attempts to titrate down FiO2.  Remains on steroids and Remdesivir.  Inflammatory markers slowly downtrending.  COVID-19 Labs:  Recent Labs    11/08/18 0407 11/09/18 0320 11/10/18 0255  DDIMER 0.90* 0.67* 0.83*  FERRITIN 177 302 408*  CRP 23.2* 10.2* 3.7*    Lab Results  Component Value Date   SARSCOV2NAA POSITIVE (A) 11/01/2018     COVID-19 Medications: 6/21>> Solu-Medrol 6/22>> Remdesivir  Transaminitis: Likely secondary to COVID 19-follow for now.  Anxiety: Start as needed Xanax-suspect worsened due to acute illness and due to her daughter being afflicted by COVID-19  Headache: Headache free this morning-we will start as needed Toradol-if persists then we can contemplate further medications.  ABG: No results found for: PHART, PCO2ART, PO2ART, HCO3, TCO2, ACIDBASEDEF, O2SAT  Condition -Stable  Family Communication  :  Daughter updated over the phone  Code Status :  Full Code  Diet :   Diet Order            Diet regular Room service appropriate? Yes; Fluid consistency: Thin  Diet effective now               Disposition Plan  :  Remain inpatient  Consults  : None  DVT Prophylaxis  :  Lovenox   Lab Results  Component Value Date   PLT 396 11/10/2018    Inpatient  Medications  Scheduled Meds: . enoxaparin (LOVENOX) injection  40 mg Subcutaneous Daily  . mouth rinse  15 mL Mouth Rinse BID  . methylPREDNISolone (SOLU-MEDROL) injection  40 mg Intravenous Q12H  . vitamin C  250 mg Oral Daily  . zolpidem  5 mg Oral QHS   Continuous Infusions: . remdesivir 100 mg in NS 250 mL 100 mg (11/10/18 1058)   PRN Meds:.acetaminophen, ALPRAZolam, benzonatate, naphazoline-pheniramine, ondansetron (ZOFRAN) IV, oxyCODONE, promethazine, traMADol  Antibiotics  :    Anti-infectives (From admission, onward)   Start     Dose/Rate Route Frequency Ordered Stop   11/09/18 1000  remdesivir 100 mg in sodium chloride 0.9 % 250 mL IVPB     100 mg 500 mL/hr over 30 Minutes Intravenous Every 24 hours 11/08/18 0925 11/13/18 0959   11/08/18 1000  remdesivir 200 mg in sodium chloride 0.9 % 250 mL IVPB     200 mg 500 mL/hr over 30 Minutes Intravenous Once 11/08/18 [id number] 11/08/18 1338       Time Spent in minutes  25   Jeoffrey MassedShanker Ghimire M.D on 11/10/2018 at 2:31 PM  To page go to [email or web address] - use universal password  [facility] -  Office  [phone]    Admit date - 11/06/2018    4    Objective:   Vitals:   11/09/18 1800 11/09/18 2150 11/10/18 0232 11/10/18 0738  BP:    125/70  Pulse: 70   86  Resp:  (!) 24  (!) 24  Temp:   98.6 F (37 C) 97.8 F (36.6 C)  TempSrc:   Oral Oral  SpO2: 94%   92%  Weight:      Height:        Wt Readings from Last 3 Encounters:  11/07/18 77.1 kg  11/01/18 74.8 kg  11/20/16 80.8 kg     Intake/Output Summary (Last 24 hours) at 11/10/2018 1431 Last data filed at 11/10/2018 0946 Gross per 24 hour  Intake 890 ml  Output -   Net 890 ml     Physical Exam Gen Exam:Alert awake-not in any distress HEENT:atraumatic, normocephalic Chest: B/L clear to auscultation anteriorly CVS:S1S2 regular Abdomen:soft non tender, non distended Extremities:no edema Neurology: Non Focal Skin: no rash   Data Review:    CBC Recent Labs  Lab 11/06/18 1851 11/08/18 0407 11/09/18 0320 11/10/18 0255  WBC 5.2 5.8 8.4 8.0  HGB 11.5* 12.0 12.1 12.1  HCT 35.7* 36.9 37.4 37.8  PLT 197 250 296 396  MCV 86.4 86.4 86.4 86.3  MCH 27.8 28.1 27.9 27.6  MCHC 32.2 32.5 32.4 32.0  RDW 13.5 13.4 13.2 13.1  LYMPHSABS 0.7 0.6* 0.5* 0.8  MONOABS 0.2 0.1 0.3 0.5  EOSABS 0.0 0.0 0.0 0.0  BASOSABS 0.0 0.0 0.0 0.0    Chemistries  Recent Labs  Lab 11/06/18 1851 11/08/18 0407 11/09/18 0320 11/10/18 0255  NA 138 137 140 138  K 3.5 3.6 4.1 4.1  CL 101 105 106 104  CO2 26 20* 23 20*  GLUCOSE 117* 139* 145* 101*  BUN 9 7 14 17   CREATININE 0.52 0.49 0.49 0.54  CALCIUM 7.9* 8.1* 8.6* 8.5*  AST 38 36 95* 140*  ALT 31 30 89* 201*  ALKPHOS 54 62 62 68  BILITOT 0.4 0.2* 0.2* 0.2*   ------------------------------------------------------------------------------------------------------------------ No results for input(s): CHOL, HDL, LDLCALC, TRIG, CHOLHDL, LDLDIRECT in the last 72 hours.  Lab Results  Component Value Date   HGBA1C 5.4 11/15/2015   ------------------------------------------------------------------------------------------------------------------ No results  for input(s): TSH, T4TOTAL, T3FREE, THYROIDAB in the last 72 hours.  Invalid input(s): FREET3 ------------------------------------------------------------------------------------------------------------------ Recent Labs    11/09/18 0320 11/10/18 0255  FERRITIN 302 408*    Coagulation profile No results for input(s): INR, PROTIME in the last 168 hours.  Recent Labs    11/09/18 0320 11/10/18 0255  DDIMER 0.67* 0.83*    Cardiac Enzymes No results  for input(s): CKMB, TROPONINI, MYOGLOBIN in the last 168 hours.  Invalid input(s): CK ------------------------------------------------------------------------------------------------------------------ No results found for: BNP  Micro Results Recent Results (from the past 240 hour(s))  SARS Coronavirus 2 (Hosp order,Performed in [facility] lab via Abbott ID)     Status: Abnormal   Collection Time: 11/01/18 10:58 PM   Specimen: Dry Nasal Swab (Abbott ID Now)  Result Value Ref Range Status   SARS Coronavirus 2 (Abbott ID Now) POSITIVE (A) NEGATIVE Final    Comment: RESULT CALLED TO, READ BACK BY AND VERIFIED WITH: ADKINS,L,RN @ 2318 11/01/18 BY GWYN,P (NOTE) Interpretive Result Comment(s): COVID 19 Positive SARS CoV 2 target nucleic acids are DETECTED. The SARS CoV 2 RNA is generally detectable in upper and lower respiratory specimens during the acute phase of infection.  Positive results are indicative of active infection with SARS CoV 2.  Clinical correlation with patient history and other diagnostic information is necessary to determine patient infection status.  Positive results do not rule out bacterial infection or coinfection with other viruses. The expected result is Negative. COVID 19 Negative SARS CoV 2 target nucleic acids are NOT DETECTED. The SARS CoV 2 RNA is generally detectable in upper and lower respiratory specimens during the acute phase of infection.  Negative results do not preclude SARS CoV 2 infection, do not rule out coinfections with other pathogens, and should not be used as the sole basis for treatment  or other patient management decisions.  Negative results must be combined with clinical observations, patient history, and epidemiological information. The expected result is Negative. Invalid Presence or absence of SARS CoV 2 nucleic acids cannot be determined. Repeat testing was performed on the submitted specimen and repeated Invalid results were  obtained.  If clinically indicated, additional testing on a new specimen with an alternate test methodology [phone]([id number]) is advised.  The SARS CoV 2 RNA is generally detectable in upper and lower respiratory specimens during the acute phase of infection. The expected result is Negative. Fact Sheet for Patients:  http://www.graves-ford.org/https://www.fda.gov/media/136524/download Fact Sheet for Healthcare Providers: EnviroConcern.sihttps://www.fda.gov/media/136523/download This test is not yet approved or cleared by the [place] FDA and has been authorized for detection and/or diagnosis of SARS CoV 2 by FDA under an Emergency Use Authorization (EUA).  This EUA  will remain in effect (meaning this test can be used) for the duration of the COVID19 declaration under Section 564(b)(1) of the Act, 21 U.S.C. section [id number] 3(b)(1), unless the authorization is terminated or revoked sooner. Performed at [facility] [place], [address] [place], [place] [address]   Group A Strep by PCR     Status: None   Collection Time: 11/01/18 11:18 PM   Specimen: Throat; Sterile Swab  Result Value Ref Range Status   Group A Strep by PCR NOT DETECTED NOT DETECTED Final    Comment: Performed at [facility] [place], [address] [place], [place] [address]  Blood Culture (routine x 2)     Status: None (Preliminary result)   Collection Time: 11/06/18  6:51 PM   Specimen: BLOOD LEFT ARM  Result Value Ref Range Status   Specimen Description   Final    BLOOD LEFT ARM Performed at [facility], [address] [place] [address] [place], [place] [address]    Special Requests   Final    BOTTLES DRAWN AEROBIC AND ANAEROBIC Blood Culture adequate volume Performed at [facility], [address] [place], [place] [address]    Culture   Final    NO GROWTH 3 DAYS Performed at [facility], [place] [address] [place], [place] [address]    Report Status PENDING  Incomplete  Blood Culture (routine x 2)     Status: None (Preliminary  result)   Collection Time: 11/06/18  7:20 PM   Specimen: BLOOD RIGHT ARM  Result Value Ref Range Status   Specimen Description   Final    BLOOD RIGHT ARM Performed at [facility], [address] [place], [place] [address]    Special Requests   Final    BOTTLES DRAWN AEROBIC AND ANAEROBIC Blood Culture adequate volume Performed at [facility], [address] [place], [place] [address]    Culture   Final    NO GROWTH 3 DAYS Performed at [facility], [place] [address] [place], [place] [address]    Report Status PENDING  Incomplete    Radiology Reports Ct Head Wo Contrast  Result Date: 11/01/2018 CLINICAL DATA:  43 y/o F; headache and fever, possible frontal sinusitis. EXAM: CT HEAD WITHOUT CONTRAST TECHNIQUE: Contiguous axial images were obtained from the base of the skull through the vertex without intravenous contrast. COMPARISON:  None. FINDINGS: Brain: No evidence of acute infarction, hemorrhage, hydrocephalus, extra-axial collection or mass lesion/mass effect. Vascular: No hyperdense vessel or unexpected calcification. Skull: Normal. Negative for fracture or focal lesion. Sinuses/Orbits: No acute finding. Other: None. IMPRESSION: Negative CT of the head.  No findings of sinusitis. Electronically Signed   By: Kristine Garbe M.D.   On: 11/01/2018 21:37   Dg Chest Port 1 View  Result Date: 11/08/2018 CLINICAL DATA:  Respiratory distress.  COVID-19. EXAM: PORTABLE CHEST 1 VIEW COMPARISON:  No prior. FINDINGS: Mediastinum and hilar structures normal. Heart size normal. Diffuse bilateral pulmonary interstitial infiltrates. Bibasilar atelectasis. No pleural effusion or pneumothorax. Degenerative change thoracic spine. IMPRESSION: Diffuse bilateral pulmonary interstitial infiltrates. Findings are consistent with viral pneumonitis. Bibasilar atelectasis. Electronically Signed   By: Marcello Moores  Register   On: 11/08/2018 07:02   Dg Chest Port 1 View  Result  Date: 11/06/2018 CLINICAL DATA:  Dyspnea EXAM: PORTABLE CHEST 1 VIEW COMPARISON:  None. FINDINGS: Low lung volume. Borderline cardiomegaly. Vague peripheral opacity suspected in the left mid lung and bilateral bases. No pneumothorax. IMPRESSION: Suspicion of vague peripheral opacity in the left greater than right lung as may be seen with atypical or viral pneumonia. Electronically Signed   By: Donavan Foil M.D.   On: 11/06/2018 19:51

## 2018-11-10 NOTE — Progress Notes (Signed)
Spoke wit pt. Daughter on the phone with updates in pt. Condition. Daughter has spoken with pt. briefly earlier this evening and has no further questions at this time.

## 2018-11-11 LAB — COMPREHENSIVE METABOLIC PANEL
ALT: 205 U/L — ABNORMAL HIGH (ref 0–44)
AST: 86 U/L — ABNORMAL HIGH (ref 15–41)
Albumin: 3 g/dL — ABNORMAL LOW (ref 3.5–5.0)
Alkaline Phosphatase: 67 U/L (ref 38–126)
Anion gap: 9 (ref 5–15)
BUN: 17 mg/dL (ref 6–20)
CO2: 23 mmol/L (ref 22–32)
Calcium: 8.4 mg/dL — ABNORMAL LOW (ref 8.9–10.3)
Chloride: 105 mmol/L (ref 98–111)
Creatinine, Ser: 0.53 mg/dL (ref 0.44–1.00)
GFR calc Af Amer: >60 mL/min (ref >60)
GFR calc non Af Amer: >60 mL/min (ref >60)
Glucose, Bld: 92 mg/dL (ref 70–99)
Potassium: 4 mmol/L (ref 3.5–5.1)
Sodium: 137 mmol/L (ref 135–145)
Total Bilirubin: 0.2 mg/dL — ABNORMAL LOW (ref 0.3–1.2)
Total Protein: 7 g/dL (ref 6.5–8.1)

## 2018-11-11 LAB — CULTURE, BLOOD (ROUTINE X 2)
Culture: NO GROWTH
Culture: NO GROWTH
Special Requests: ADEQUATE
Special Requests: ADEQUATE

## 2018-11-11 LAB — CBC WITH DIFFERENTIAL/PLATELET
Abs Immature Granulocytes: 0.13 10*3/uL — ABNORMAL HIGH (ref 0.00–0.07)
Basophils Absolute: 0 10*3/uL (ref 0.0–0.1)
Basophils Relative: 0 %
Eosinophils Absolute: 0 10*3/uL (ref 0.0–0.5)
Eosinophils Relative: 0 %
HCT: 40.4 % (ref 36.0–46.0)
Hemoglobin: 13.2 g/dL (ref 12.0–15.0)
Immature Granulocytes: 2 %
Lymphocytes Relative: 16 %
Lymphs Abs: 1 10*3/uL (ref 0.7–4.0)
MCH: 28 pg (ref 26.0–34.0)
MCHC: 32.7 g/dL (ref 30.0–36.0)
MCV: 85.6 fL (ref 80.0–100.0)
Monocytes Absolute: 0.4 10*3/uL (ref 0.1–1.0)
Monocytes Relative: 7 %
Neutro Abs: 4.6 10*3/uL (ref 1.7–7.7)
Neutrophils Relative %: 75 %
Platelets: 424 10*3/uL — ABNORMAL HIGH (ref 150–400)
RBC: 4.72 MIL/uL (ref 3.87–5.11)
RDW: 13.1 % (ref 11.5–15.5)
WBC: 6.1 10*3/uL (ref 4.0–10.5)
nRBC: 0 % (ref 0.0–0.2)

## 2018-11-11 LAB — FERRITIN: Ferritin: 211 ng/mL (ref 11–307)

## 2018-11-11 LAB — D-DIMER, QUANTITATIVE: D-Dimer, Quant: 0.84 ug/mL-FEU — ABNORMAL HIGH (ref 0.00–0.50)

## 2018-11-11 LAB — C-REACTIVE PROTEIN: CRP: 2.3 mg/dL — ABNORMAL HIGH (ref <1.0)

## 2018-11-11 NOTE — Progress Notes (Signed)
Patient is speaking to her husband and daughters on a regular basis. She states that she is giving them updates on her condition. She will ask [place] to call them if she feels there is a need.

## 2018-11-11 NOTE — Evaluation (Signed)
Physical Therapy Evaluation & Discharge Patient Details Name: Miranda Gates MRN: [id number] DOB: 09/01/1975 Today's Date: 11/11/2018   History of Present Illness  Pt is a 43 y.o. female admitted 11/06/18 with hypoxic respiratory failure due to COVID-19 viral pneumonia. No PMH on file.    Clinical Impression  Patient evaluated by Physical Therapy with no further acute PT needs identified. PTA, pt indep and lives with family. Today, pt indep with mobility. SpO2 down to 84% while ambulating on RA; returning to >/88% on 2L O2 [place]. All education has been completed and the patient has no further questions. Pt encouraged to continue ambulating with assist from staff as needed. Acute PT is signing off. Thank you for this referral.    Follow Up Recommendations No PT follow up    Equipment Recommendations  None recommended by PT    Recommendations for Other Services       Precautions / Restrictions Precautions Precautions: None Restrictions Weight Bearing Restrictions: No      Mobility  Bed Mobility Overal bed mobility: Independent                Transfers Overall transfer level: Independent Equipment used: None                Ambulation/Gait Ambulation/Gait assistance: Independent Gait Distance (Feet): 500 Feet Assistive device: None Gait Pattern/deviations: Step-through pattern;Decreased stride length Gait velocity: Decreased Gait velocity interpretation: 1.31 - 2.62 ft/sec, indicative of limited community ambulator General Gait Details: Indep with ambulation. SpO2 down to 84% on RA, returning to >88% on 2L O2 [place]  Stairs            Wheelchair Mobility    Modified Rankin (Stroke Patients Only)       Balance Overall balance assessment: Needs assistance   Sitting balance-Leahy Scale: Good       Standing balance-Leahy Scale: Good               High level balance activites: Side stepping;Backward walking;Direction changes;Turns;Sudden  stops;Head turns High Level Balance Comments: Able to perform higher level balance activities and bend down to pick object off floor without instability or LOB             Pertinent Vitals/Pain Pain Assessment: No/denies pain    Home Living Family/patient expects to be discharged to:: Private residence Living Arrangements: Spouse/significant other;Children;Other relatives Available Help at Discharge: Family;Available 24 hours/day Type of Home: House Home Access: Stairs to enter     Home Layout: Two level Home Equipment: None      Prior Function Level of Independence: Independent               Hand Dominance        Extremity/Trunk Assessment   Upper Extremity Assessment Upper Extremity Assessment: Overall WFL for tasks assessed    Lower Extremity Assessment Lower Extremity Assessment: Overall WFL for tasks assessed       Communication   Communication: No difficulties  Cognition Arousal/Alertness: Awake/alert Behavior During Therapy: WFL for tasks assessed/performed Overall Cognitive Status: Within Functional Limits for tasks assessed                                        General Comments      Exercises     Assessment/Plan    PT Assessment Patent does not need any further PT services  PT Problem List  PT Treatment Interventions      PT Goals (Current goals can be found in the Care Plan section)  Acute Rehab PT Goals PT Goal Formulation: All assessment and education complete, DC therapy    Frequency     Barriers to discharge        Co-evaluation               AM-PAC PT "6 Clicks" Mobility  Outcome Measure Help needed turning from your back to your side while in a flat bed without using bedrails?: None Help needed moving from lying on your back to sitting on the side of a flat bed without using bedrails?: None Help needed moving to and from a bed to a chair (including a wheelchair)?: None Help needed  standing up from a chair using your arms (e.g., wheelchair or bedside chair)?: None Help needed to walk in hospital room?: None Help needed climbing 3-5 steps with a railing? : A Little 6 Click Score: 23    End of Session Equipment Utilized During Treatment: Oxygen Activity Tolerance: Patient tolerated treatment well Patient left: in bed;with call bell/phone within reach Nurse Communication: Mobility status PT Visit Diagnosis: Other abnormalities of gait and mobility (R26.89)    Time: 8338-2505 PT Time Calculation (min) (ACUTE ONLY): 16 min   Charges:   PT Evaluation $PT Eval Low Complexity: [place], PT, DPT Acute Rehabilitation Services  Pager [phone] Office [place] 11/11/2018, 3:23 PM

## 2018-11-11 NOTE — Progress Notes (Signed)
Pt ambulated 200 ft on RA, sats >=88, mainly > 90. Respiratory effort unlabored, no tachypnea and vss. Returned to room to bed on RA and persisting w/ sats > 90 on RA.

## 2018-11-11 NOTE — Progress Notes (Signed)
PROGRESS NOTE                                                                                                                                                                                                             Patient Demographics:    Miranda Gates, is a 43 y.o. female, DOB - Mar 24, 1976, ZOX:[id number]  Outpatient Primary MD for the patient is Constant, Vickii Chafe, MD    LOS - 5  Chief Complaint  Patient presents with  . Respiratory Distress       Brief Narrative: Patient is a 43 y.o. female with no significant PMHx presented with acute hypoxic respiratory failure due to COVID-19 viral pneumonia.  See below for further details   Subjective:    Miranda Gates feels slightly better-she was getting out of bed to chair when I saw her this morning.  Continues to cough.  Less anxious than yesterday.  Denies any headaches to me.   Assessment  & Plan :   Acute Hypoxic Resp Failure due to Covid 19 Viral pneumonia: Essentially the same compared to yesterday-at best may be slightly improved.  Not in any distress.  Continue to attempt to slowly titrate down FiO2.  Remains on steroids and Remdesivir.  Inflammatory markers are downtrending.  COVID-19 Labs:  Recent Labs    11/09/18 0320 11/10/18 0255 11/11/18 0304  DDIMER 0.67* 0.83* 0.84*  FERRITIN 302 408* 211  CRP 10.2* 3.7* 2.3*    Lab Results  Component Value Date   SARSCOV2NAA POSITIVE (A) 11/01/2018     COVID-19 Medications: 6/21>> Solu-Medrol 6/22>> Remdesivir  Transaminitis: Likely secondary to COVID 19-follow closely for now-as patient is on Remdesivir.  Anxiety: Seems to be well-controlled-continue as needed Xanax.  Suspect has some amount of anxiety at baseline-worsened by acute illness and her daughter being afflicted with [id number].    Headache: Headache free this morning-continue as needed Toradol.    ABG: No results found for: PHART, PCO2ART,  PO2ART, HCO3, TCO2, ACIDBASEDEF, O2SAT  Condition -Stable  Family Communication  :  Daughter updated over the phone 6/25  Code Status :  Full Code  Diet :  Diet Order            Diet regular Room service appropriate? Yes; Fluid consistency: Thin  Diet effective now  Disposition Plan  :  Remain inpatient  Consults  : None  DVT Prophylaxis  :  Lovenox   Lab Results  Component Value Date   PLT 424 (H) 11/11/2018    Inpatient Medications  Scheduled Meds: . enoxaparin (LOVENOX) injection  40 mg Subcutaneous Daily  . famotidine  20 mg Oral Daily  . mouth rinse  15 mL Mouth Rinse BID  . methylPREDNISolone (SOLU-MEDROL) injection  40 mg Intravenous Q12H  . vitamin C  250 mg Oral Daily  . zolpidem  5 mg Oral QHS   Continuous Infusions: . remdesivir 100 mg in NS 250 mL 100 mg (11/10/18 1058)   PRN Meds:.acetaminophen, ALPRAZolam, benzonatate, ketorolac, lip balm, naphazoline-pheniramine, ondansetron (ZOFRAN) IV, oxyCODONE, promethazine, traMADol  Antibiotics  :    Anti-infectives (From admission, onward)   Start     Dose/Rate Route Frequency Ordered Stop   11/09/18 1000  remdesivir 100 mg in sodium chloride 0.9 % 250 mL IVPB     100 mg 500 mL/hr over 30 Minutes Intravenous Every 24 hours 11/08/18 0925 11/13/18 0959   11/08/18 1000  remdesivir 200 mg in sodium chloride 0.9 % 250 mL IVPB     200 mg 500 mL/hr over 30 Minutes Intravenous Once 11/08/18 1610 11/08/18 1338       Time Spent in minutes  25   Jeoffrey Massed M.D on 11/11/2018 at 11:22 AM  To page go to [email or web address] - use universal password  [facility] -  Office  [phone]    Admit date - 11/06/2018    5    Objective:   Vitals:   11/10/18 0738 11/10/18 1544 11/10/18 2129 11/11/18 0734  BP: 125/70 (!) 110/50 119/69 119/69  Pulse: 86 65 62 93  Resp: (!) Temp: 97.8 F (36.6 C) 98 F (36.7 C) 97.7 F (36.5 C) 97.7 F (36.5 C)  TempSrc: Oral Oral Oral Oral   SpO2: 92% 97%  92%  Weight:      Height:        Wt Readings from Last 3 Encounters:  11/07/18 77.1 kg  11/01/18 74.8 kg  11/20/16 80.8 kg     Intake/Output Summary (Last 24 hours) at 11/11/2018 1122 Last data filed at 11/10/2018 1846 Gross per 24 hour  Intake 480 ml  Output -  Net 480 ml     Physical Exam General appearance:Awake, alert, not in any distress.  Eyes:no scleral icterus. HEENT: Atraumatic and Normocephalic Neck: supple, no JVD. Resp:Good air entry bilaterally, fine bibasilar rales CVS: S1 S2 regular, no murmurs.  GI: Bowel sounds present, Non tender and not distended with no gaurding, rigidity or rebound. Extremities: B/L Lower Ext shows no edema, both legs are warm to touch Neurology:  Non focal Psychiatric: Normal judgment and insight. Normal mood. Musculoskeletal:No digital cyanosis Skin:No Rash, warm and dry Wounds:N/A   Data Review:    CBC Recent Labs  Lab 11/06/18 1851 11/08/18 0407 11/09/18 0320 11/10/18 0255 11/11/18 0304  WBC 5.2 5.8 8.4 8.0 6.1  HGB 11.5* 12.0 12.1 12.1 13.2  HCT 35.7* 36.9 37.4 37.8 40.4  PLT 197 250 296 396 424*  MCV 86.4 86.4 86.4 86.3 85.6  MCH 27.8 28.1 27.9 27.6 28.0  MCHC 32.2 32.5 32.4 32.0 32.7  RDW 13.5 13.4 13.2 13.1 13.1  LYMPHSABS 0.7 0.6* 0.5* 0.8 1.0  MONOABS 0.2 0.1 0.3 0.5 0.4  EOSABS 0.0 0.0 0.0 0.0 0.0  BASOSABS 0.0 0.0 0.0 0.0 0.0    Chemistries  Recent Labs  Lab 11/06/18 1851 11/08/18 0407 11/09/18 0320 11/10/18 0255 11/11/18 0304  NA 138 137 140 138 137  K 3.5 3.6 4.1 4.1 4.0  CL 101 105 106 104 105  CO2 26 20* 23 20* 23  GLUCOSE 117* 139* 145* 101* 92  BUN 9 7 14 17 17   CREATININE 0.52 0.49 0.49 0.54 0.53  CALCIUM 7.9* 8.1* 8.6* 8.5* 8.4*  AST 38 36 95* 140* 86*  ALT 31 30 89* 201* 205*  ALKPHOS 54 62 62 68 67  BILITOT 0.4 0.2* 0.2* 0.2* 0.2*   ------------------------------------------------------------------------------------------------------------------ No results for  input(s): CHOL, HDL, LDLCALC, TRIG, CHOLHDL, LDLDIRECT in the last 72 hours.  Lab Results  Component Value Date   HGBA1C 5.4 11/15/2015   ------------------------------------------------------------------------------------------------------------------ No results for input(s): TSH, T4TOTAL, T3FREE, THYROIDAB in the last 72 hours.  Invalid input(s): FREET3 ------------------------------------------------------------------------------------------------------------------ Recent Labs    11/10/18 0255 11/11/18 0304  FERRITIN 408* 211    Coagulation profile No results for input(s): INR, PROTIME in the last 168 hours.  Recent Labs    11/10/18 0255 11/11/18 0304  DDIMER 0.83* 0.84*    Cardiac Enzymes No results for input(s): CKMB, TROPONINI, MYOGLOBIN in the last 168 hours.  Invalid input(s): CK ------------------------------------------------------------------------------------------------------------------ No results found for: BNP  Micro Results Recent Results (from the past 240 hour(s))  SARS Coronavirus 2 (Hosp order,Performed in [facility] lab via [employer] ID)     Status: Abnormal   Collection Time: 11/01/18 10:58 PM   Specimen: Dry Nasal Swab ([employer] ID Now)  Result Value Ref Range Status   SARS Coronavirus 2 ([employer] ID Now) POSITIVE (A) NEGATIVE Final    Comment: RESULT CALLED TO, READ BACK BY AND VERIFIED WITH: ADKINS,L,RN @ 2318 11/01/18 BY GWYN,P (NOTE) Interpretive Result Comment(s): COVID 19 Positive SARS CoV 2 target nucleic acids are DETECTED. The SARS CoV 2 RNA is generally detectable in upper and lower respiratory specimens during the acute phase of infection.  Positive results are indicative of active infection with SARS CoV 2.  Clinical correlation with patient history and other diagnostic information is necessary to determine patient infection status.  Positive results do not rule out bacterial infection or coinfection with other viruses. The expected  result is Negative. COVID 19 Negative SARS CoV 2 target nucleic acids are NOT DETECTED. The SARS CoV 2 RNA is generally detectable in upper and lower respiratory specimens during the acute phase of infection.  Negative results do not preclude SARS CoV 2 infection, do not rule out coinfections with other pathogens, and should not be used as the sole basis for treatment  or other patient management decisions.  Negative results must be combined with clinical observations, patient history, and epidemiological information. The expected result is Negative. Invalid Presence or absence of SARS CoV 2 nucleic acids cannot be determined. Repeat testing was performed on the submitted specimen and repeated Invalid results were obtained.  If clinically indicated, additional testing on a new specimen with an alternate test methodology [phone]([id number]) is advised.  The SARS CoV 2 RNA is generally detectable in upper and lower respiratory specimens during the acute phase of infection. The expected result is Negative. Fact Sheet for Patients:  http://www.graves-ford.org/https://www.fda.gov/media/136524/download Fact Sheet for Healthcare Providers: EnviroConcern.sihttps://www.fda.gov/media/136523/download This test is not yet approved or cleared by the [place] FDA and has been authorized for detection and/or diagnosis of SARS CoV 2 by FDA under an Emergency Use Authorization (EUA).  This EUA  will remain in effect (meaning this test can  be used) for the duration of the COVID19 declaration under Section 564(b)(1) of the Act, 21 U.S.C. section [phone] 3(b)(1), unless the authorization is terminated or revoked sooner. Performed at [facility] [place], [address] [place], [place] [address]   Group A Strep by PCR     Status: None   Collection Time: 11/01/18 11:18 PM   Specimen: Throat; Sterile Swab  Result Value Ref Range Status   Group A Strep by PCR NOT DETECTED NOT DETECTED Final    Comment: Performed at [facility] [place], [address] [place], [place] [address]  Blood Culture (routine x 2)     Status: None   Collection Time: 11/06/18  6:51 PM   Specimen: BLOOD LEFT ARM  Result Value Ref Range Status   Specimen Description   Final    BLOOD LEFT ARM Performed at [facility] [place], [address] [place], [place] [address]    Special Requests   Final    BOTTLES DRAWN AEROBIC AND ANAEROBIC Blood Culture adequate volume Performed at [facility] [place], [address] [place], [place] [address]    Culture   Final    NO GROWTH 5 DAYS Performed at [facility], [address] [place], [place] [address]    Report Status 11/11/2018 FINAL  Final  Blood Culture (routine x 2)     Status: None   Collection Time: 11/06/18  7:20 PM   Specimen: BLOOD RIGHT ARM  Result Value Ref Range Status   Specimen Description   Final    BLOOD RIGHT ARM Performed at [facility] [place], [address] [place], [place] [address]    Special Requests   Final    BOTTLES DRAWN AEROBIC AND ANAEROBIC Blood Culture adequate volume Performed at [facility] [place], [address] [place], [place] [address]    Culture   Final    NO GROWTH 5 DAYS Performed at [facility], [address] [place], [place] [address]    Report Status 11/11/2018 FINAL  Final    Radiology Reports Ct Head Wo Contrast  Result Date: 11/01/2018 CLINICAL DATA:  43 y/o F; headache and fever, possible frontal sinusitis. EXAM: CT HEAD WITHOUT CONTRAST TECHNIQUE: Contiguous axial images were obtained from the base of the skull through the vertex without intravenous contrast. COMPARISON:  None. FINDINGS: Brain: No evidence of acute infarction, hemorrhage, hydrocephalus, extra-axial collection or mass lesion/mass effect. Vascular: No hyperdense vessel or unexpected calcification. Skull: Normal. Negative for fracture or focal lesion. Sinuses/Orbits: No acute finding. Other: None. IMPRESSION: Negative CT of the head.  No findings of  sinusitis. Electronically Signed   By: Mitzi HansenLance  Furusawa-Stratton M.D.   On: 11/01/2018 21:37   Dg Chest Port 1 View  Result Date: 11/08/2018 CLINICAL DATA:  Respiratory distress.  COVID-19. EXAM: PORTABLE CHEST 1 VIEW COMPARISON:  No prior. FINDINGS: Mediastinum and hilar structures normal. Heart size normal. Diffuse bilateral pulmonary interstitial infiltrates. Bibasilar atelectasis. No pleural effusion or pneumothorax. Degenerative change thoracic spine. IMPRESSION: Diffuse bilateral pulmonary interstitial infiltrates. Findings are consistent with viral pneumonitis. Bibasilar atelectasis. Electronically Signed   By: Maisie Fushomas  Register   On: 11/08/2018 07:02   Dg Chest Port 1 View  Result Date: 11/06/2018 CLINICAL DATA:  Dyspnea EXAM: PORTABLE CHEST 1 VIEW COMPARISON:  None. FINDINGS: Low lung volume. Borderline cardiomegaly. Vague peripheral opacity suspected in the left mid lung and bilateral bases. No pneumothorax. IMPRESSION: Suspicion  of vague peripheral opacity in the left greater than right lung as may be seen with atypical or viral pneumonia. Electronically Signed   By: Jasmine PangKim  Fujinaga M.D.   On: 11/06/2018 19:51

## 2018-11-12 DIAGNOSIS — J069 Acute upper respiratory infection, unspecified: Secondary | ICD-10-CM

## 2018-11-12 DIAGNOSIS — J1289 Other viral pneumonia: Secondary | ICD-10-CM

## 2018-11-12 DIAGNOSIS — U071 COVID-19: Principal | ICD-10-CM

## 2018-11-12 LAB — CBC WITH DIFFERENTIAL/PLATELET
Abs Immature Granulocytes: 0.15 10*3/uL — ABNORMAL HIGH (ref 0.00–0.07)
Basophils Absolute: 0 10*3/uL (ref 0.0–0.1)
Basophils Relative: 0 %
Eosinophils Absolute: 0 10*3/uL (ref 0.0–0.5)
Eosinophils Relative: 0 %
HCT: 38.4 % (ref 36.0–46.0)
Hemoglobin: 12.4 g/dL (ref 12.0–15.0)
Immature Granulocytes: 2 %
Lymphocytes Relative: 16 %
Lymphs Abs: 1.2 10*3/uL (ref 0.7–4.0)
MCH: 27.7 pg (ref 26.0–34.0)
MCHC: 32.3 g/dL (ref 30.0–36.0)
MCV: 85.7 fL (ref 80.0–100.0)
Monocytes Absolute: 0.5 10*3/uL (ref 0.1–1.0)
Monocytes Relative: 7 %
Neutro Abs: 5.6 10*3/uL (ref 1.7–7.7)
Neutrophils Relative %: 75 %
Platelets: 419 10*3/uL — ABNORMAL HIGH (ref 150–400)
RBC: 4.48 MIL/uL (ref 3.87–5.11)
RDW: 13.1 % (ref 11.5–15.5)
WBC: 7.6 10*3/uL (ref 4.0–10.5)
nRBC: 0 % (ref 0.0–0.2)

## 2018-11-12 LAB — COMPREHENSIVE METABOLIC PANEL
ALT: 139 U/L — ABNORMAL HIGH (ref 0–44)
AST: 35 U/L (ref 15–41)
Albumin: 2.8 g/dL — ABNORMAL LOW (ref 3.5–5.0)
Alkaline Phosphatase: 58 U/L (ref 38–126)
Anion gap: 9 (ref 5–15)
BUN: 18 mg/dL (ref 6–20)
CO2: 23 mmol/L (ref 22–32)
Calcium: 8.3 mg/dL — ABNORMAL LOW (ref 8.9–10.3)
Chloride: 106 mmol/L (ref 98–111)
Creatinine, Ser: 0.58 mg/dL (ref 0.44–1.00)
GFR calc Af Amer: >60 mL/min (ref >60)
GFR calc non Af Amer: >60 mL/min (ref >60)
Glucose, Bld: 100 mg/dL — ABNORMAL HIGH (ref 70–99)
Potassium: 4.3 mmol/L (ref 3.5–5.1)
Sodium: 138 mmol/L (ref 135–145)
Total Bilirubin: 0.2 mg/dL — ABNORMAL LOW (ref 0.3–1.2)
Total Protein: 6.4 g/dL — ABNORMAL LOW (ref 6.5–8.1)

## 2018-11-12 LAB — C-REACTIVE PROTEIN: CRP: 1.4 mg/dL — ABNORMAL HIGH (ref <1.0)

## 2018-11-12 LAB — FERRITIN: Ferritin: 116 ng/mL (ref 11–307)

## 2018-11-12 LAB — BRAIN NATRIURETIC PEPTIDE: B Natriuretic Peptide: 14.7 pg/mL (ref 0.0–100.0)

## 2018-11-12 LAB — D-DIMER, QUANTITATIVE: D-Dimer, Quant: 0.73 ug/mL-FEU — ABNORMAL HIGH (ref 0.00–0.50)

## 2018-11-12 MED ORDER — ALBUTEROL SULFATE HFA 108 (90 BASE) MCG/ACT IN AERS: 2.0000 | INHALATION_SPRAY | Freq: Four times a day (QID) | RESPIRATORY_TRACT | 0 refills | Status: AC | PRN

## 2018-11-12 MED ORDER — BENZONATATE 200 MG PO CAPS: 200.0000 mg | ORAL_CAPSULE | Freq: Three times a day (TID) | ORAL | 0 refills | Status: AC | PRN

## 2018-11-12 NOTE — Discharge Instructions (Signed)
Person Under Monitoring Name: Miranda Gates  Location: [place] [address]   Infection Prevention Recommendations for Individuals Confirmed to have, or Being Evaluated for, 2019 Novel Coronavirus (COVID-19) Infection Who Receive Care at Home  Individuals who are confirmed to have, or are being evaluated for, COVID-19 should follow the prevention steps below until a healthcare provider or local or state health department says they can return to normal activities.  Stay home except to get medical care You should restrict activities outside your home, except for getting medical care. Do not go to work, school, or public areas, and do not use public transportation or taxis.  Call ahead before visiting your doctor Before your medical appointment, call the healthcare provider and tell them that you have, or are being evaluated for, COVID-19 infection. This will help the healthcare providers office take steps to keep other people from getting infected. Ask your healthcare provider to call the local or state health department.  Monitor your symptoms Seek prompt medical attention if your illness is worsening (e.g., difficulty breathing). Before going to your medical appointment, call the healthcare provider and tell them that you have, or are being evaluated for, COVID-19 infection. Ask your healthcare provider to call the local or state health department.  Wear a facemask You should wear a facemask that covers your nose and mouth when you are in the same room with other people and when you visit a healthcare provider. People who live with or visit you should also wear a facemask while they are in the same room with you.  Separate yourself from other people in your home As much as possible, you should stay in a different room from other people in your home. Also, you should use a separate bathroom, if available.  Avoid sharing household items You  should not share dishes, drinking glasses, cups, eating utensils, towels, bedding, or other items with other people in your home. After using these items, you should wash them thoroughly with soap and water.  Cover your coughs and sneezes Cover your mouth and nose with a tissue when you cough or sneeze, or you can cough or sneeze into your sleeve. Throw used tissues in a lined trash can, and immediately wash your hands with soap and water for at least 20 seconds or use an alcohol-based hand rub.  Wash your [facility] your hands often and thoroughly with soap and water for at least 20 seconds. You can use an alcohol-based hand sanitizer if soap and water are not available and if your hands are not visibly dirty. Avoid touching your eyes, nose, and mouth with unwashed hands.   Prevention Steps for Caregivers and Household Members of Individuals Confirmed to have, or Being Evaluated for, COVID-19 Infection Being Cared for in the Home  If you live with, or provide care at home for, a person confirmed to have, or being evaluated for, COVID-19 infection please follow these guidelines to prevent infection:  Follow healthcare providers instructions Make sure that you understand and can help the patient follow any healthcare provider instructions for all care.  Provide for the patients basic needs You should help the patient with basic needs in the home and provide support for getting groceries, prescriptions, and other personal needs.  Monitor the patients symptoms If they are getting sicker, call his or her medical provider and tell them that the patient has, or is being evaluated for, COVID-19 infection. This will help the healthcare providers  office take steps to keep other people from getting infected. Ask the healthcare provider to call the local or state health department.  Limit the number of people who have contact with the patient  If possible, have only one caregiver for the  patient.  Other household members should stay in another home or place of residence. If this is not possible, they should stay  in another room, or be separated from the patient as much as possible. Use a separate bathroom, if available.  Restrict visitors who do not have an essential need to be in the home.  Keep older adults, very young children, and other sick people away from the patient Keep older adults, very young children, and those who have compromised immune systems or chronic health conditions away from the patient. This includes people with chronic heart, lung, or kidney conditions, diabetes, and cancer.  Ensure good ventilation Make sure that shared spaces in the home have good air flow, such as from an air conditioner or an opened window, weather permitting.  Wash your hands often  Wash your hands often and thoroughly with soap and water for at least 20 seconds. You can use an alcohol based hand sanitizer if soap and water are not available and if your hands are not visibly dirty.  Avoid touching your eyes, nose, and mouth with unwashed hands.  Use disposable paper towels to dry your hands. If not available, use dedicated cloth towels and replace them when they become wet.  Wear a facemask and gloves  Wear a disposable facemask at all times in the room and gloves when you touch or have contact with the patients blood, body fluids, and/or secretions or excretions, such as sweat, saliva, sputum, nasal mucus, vomit, urine, or feces.  Ensure the mask fits over your nose and mouth tightly, and do not touch it during use.  Throw out disposable facemasks and gloves after using them. Do not reuse.  Wash your hands immediately after removing your facemask and gloves.  If your personal clothing becomes contaminated, carefully remove clothing and launder. Wash your hands after handling contaminated clothing.  Place all used disposable facemasks, gloves, and other waste in a lined  container before disposing them with other household waste.  Remove gloves and wash your hands immediately after handling these items.  Do not share dishes, glasses, or other household items with the patient  Avoid sharing household items. You should not share dishes, drinking glasses, cups, eating utensils, towels, bedding, or other items with a patient who is confirmed to have, or being evaluated for, COVID-19 infection.  After the person uses these items, you should wash them thoroughly with soap and water.  Wash laundry thoroughly  Immediately remove and wash clothes or bedding that have blood, body fluids, and/or secretions or excretions, such as sweat, saliva, sputum, nasal mucus, vomit, urine, or feces, on them.  Wear gloves when handling laundry from the patient.  Read and follow directions on labels of laundry or clothing items and detergent. In general, wash and dry with the warmest temperatures recommended on the label.  Clean all areas the individual has used often  Clean all touchable surfaces, such as counters, tabletops, doorknobs, bathroom fixtures, toilets, phones, keyboards, tablets, and bedside tables, every day. Also, clean any surfaces that may have blood, body fluids, and/or secretions or excretions on them.  Wear gloves when cleaning surfaces the patient has come in contact with.  Use a diluted bleach solution (e.g., dilute bleach with 1  part bleach and 10 parts water) or a household disinfectant with a label that says EPA-registered for coronaviruses. To make a bleach solution at home, add 1 tablespoon of bleach to 1 quart (4 cups) of water. For a larger supply, add  cup of bleach to 1 gallon (16 cups) of water.  Read labels of cleaning products and follow recommendations provided on product labels. Labels contain instructions for safe and effective use of the cleaning product including precautions you should take when applying the product, such as wearing gloves or  eye protection and making sure you have good ventilation during use of the product.  Remove gloves and wash hands immediately after cleaning.  Monitor yourself for signs and symptoms of illness Caregivers and household members are considered close contacts, should monitor their health, and will be asked to limit movement outside of the home to the extent possible. Follow the monitoring steps for close contacts listed on the symptom monitoring form.   ? If you have additional questions, contact your local health department or call the epidemiologist on call at [phone] (available 24/7). ? This guidance is subject to change. For the most up-to-date guidance from [facility], please refer to their website: TripMetro.huhttps://www.cdc.gov/coronavirus/2019-ncov/hcp/guidance-prevent-spread.html      Person Under Monitoring Name: Miranda Gates  Location: [address] [place] [id number]   CORONAVIRUS DISEASE 2019 (COVID-19) Guidance for Persons Under Investigation You are being tested for the virus that causes coronavirus disease 2019 (COVID-19). Public health actions are necessary to ensure protection of your health and the health of others, and to prevent further spread of infection. COVID-19 is caused by a virus that can cause symptoms, such as fever, cough, and shortness of breath. The primary transmission from person to person is by coughing or sneezing. On June 17, 2018, the World Health Organization announced a [employer] Emergency of International Concern and on June 18, 2018 the U.S. Department of Health and Human Services declared a public health emergency. If the virus that causesCOVID-19 spreads in the community, it could have severe public health consequences.  As a person under investigation for COVID-19, the [place] Department of Health and [employer], Division of [employer] advises you to adhere to the following guidance until your test results are  reported to you. If your test result is positive, you will receive additional information from your provider and your local health department at that time.   Remain at home until you are cleared by your health provider or public health authorities.   Keep a log of visitors to your home using the form provided. Any visitors to your home must be aware of your isolation status.  If you plan to move to a new address or leave the county, notify the local health department in your county.  Call a doctor or seek care if you have an urgent medical need. Before seeking medical care, call ahead and get instructions from the provider before arriving at the medical office, clinic or hospital. Notify them that you are being tested for the virus that causes COVID-19 so arrangements can be made, as necessary, to prevent transmission to others in the healthcare setting. Next, notify the local health department in your county.  If a medical emergency arises and you need to call 911, inform the first responders that you are being tested for the virus that causes COVID-19. Next, notify the local health department in your county.  Adhere to all guidance set forth by the [place] for [place] of patients that is based on guidance from the Center for Disease Control and Prevention with suspected or confirmed COVID-19. It is provided with this guidance for Persons Under Investigation.  Your health and the health of our community are our top priorities. Public Health officials remain available to provide assistance and counseling to you about COVID-19 and compliance with this guidance.  Provider: ____________________________________________________________ Date: ______/_____/_________  By signing below, you acknowledge that you have read and agree to comply with this Guidance for Persons Under Investigation. ______________________________________________________________ Date:  ______/_____/_________  WHO DO I CALL? You can find a list of local health departments here: [email or web address] Health Department: ____________________________________________________________________ Contact Name: ________________________________________________________________________ Telephone: ___________________________________________________________________________  Marice Potter, [place], Communicable Disease Branch COVID-19 Guidance for Persons Under Investigation July 24, 2018

## 2018-11-12 NOTE — Progress Notes (Signed)
Wasted 5mg  Ambien in the wast container with Estil Daft RN

## 2018-11-12 NOTE — Progress Notes (Signed)
Much better-titrated down to room air-has ambulated around the hallway per RN with O2 saturation remaining above 92%.  Patient is very anxious to go home today-her younger daughter has also tested positive for COVID-19.  Since she has made a remarkable improvement overnight-I suspect she is stable for discharge later today.  See discharge summary for details.

## 2018-11-12 NOTE — Discharge Summary (Signed)
PATIENT DETAILS Name: Miranda Gates Age: 43 y.o. Sex: female Date of Birth: 1976-02-27 MRN: [id number]. Admitting Physician: Dorcas Carrow, MD WUJ:[id number], Gigi Gin, MD  Admit Date: 11/06/2018 Discharge date: 11/12/2018  Recommendations for Outpatient Follow-up:  1. Follow up with PCP in 1-2 weeks 2. Please obtain BMP/CBC in one week  Admitted From:  Home  Disposition: Home  Home Health: No  Equipment/Devices: None  Discharge Condition: Stable  CODE STATUS: FULL CODE  Diet recommendation:  Regular   Brief Summary: See H&P, Labs, Consult and Test reports for all details in brief, Patient is a 43 y.o. female with no significant PMHx presented with acute hypoxic respiratory failure due to COVID-19 viral pneumonia.  See below for further details   Brief Hospital Course: Acute Hypoxic Resp Failure due to Covid 19 Viral pneumonia:  Significantly improved-requiring up to 5-6 L of oxygen-treated with steroids and Remdesivir with significant improvement.  Inflammatory markers are downtrending.  By day of discharge-titrated to room air-patient reports significant clinical improvements and is very anxious to go home this morning.  Per RN-patient has ambulated around the unit with O2 sats remaining persistently above 92%.  Since markedly better-stable for discharge later today-she will complete her fifth and last dose of Remdesivir today (before discharge).  Transaminitis: Likely secondary to COVID 19-slowly downtrending-follow with PCP for repeat LFTs in the next week or so.   Anxiety: Worsened due to acute illness and due to stress (daughter with COVID-19 diagnosis while she was hospitalized) treated with Xanax.  Does not require any antianxiety medications on discharge.  Follow with PCP for further care.   Headache: Headache free this morning-treated with as needed Toradol.  Procedures/Studies: None  Discharge Diagnoses:  Active Problems:   Pneumonia due to  COVID-19 virus  Discharge Instructions:  Activity:  As tolerated   Discharge Instructions    Call MD for:  difficulty breathing, headache or visual disturbances   Complete by: As directed    Call MD for:  extreme fatigue   Complete by: As directed    Call MD for:  persistant nausea and vomiting   Complete by: As directed    Call MD for:  temperature >100.4   Complete by: As directed    Diet general   Complete by: As directed    Discharge instructions   Complete by: As directed    Follow with Primary MD  Constant, Peggy, MD in 1 week  Please get a complete blood count and chemistry panel checked by your Primary MD at your next visit, and again as instructed by your Primary MD.  Get Medicines reviewed and adjusted: Please take all your medications with you for your next visit with your Primary MD  Laboratory/radiological data: Please request your Primary MD to go over all hospital tests and procedure/radiological results at the follow up, please ask your Primary MD to get all Hospital records sent to his/her office.  In some cases, they will be blood work, cultures and biopsy results pending at the time of your discharge. Please request that your primary care M.D. follows up on these results.  Also Note the following: If you experience worsening of your admission symptoms, develop shortness of breath, life threatening emergency, suicidal or homicidal thoughts you must seek medical attention immediately by calling 911 or calling your MD immediately  if symptoms less severe.  You must read complete instructions/literature along with all the possible adverse reactions/side effects for all the Medicines you take and that have been  prescribed to you. Take any new Medicines after you have completely understood and accpet all the possible adverse reactions/side effects.   Do not drive when taking Pain medications or sleeping medications (Benzodaizepines)  Do not take more than  prescribed Pain, Sleep and Anxiety Medications. It is not advisable to combine anxiety,sleep and pain medications without talking with your primary care practitioner  Special Instructions: If you have smoked or chewed Tobacco  in the last 2 yrs please stop smoking, stop any regular Alcohol  and or any Recreational drug use.  Wear Seat belts while driving.  Please note: You were cared for by a hospitalist during your hospital stay. Once you are discharged, your primary care physician will handle any further medical issues. Please note that NO REFILLS for any discharge medications will be authorized once you are discharged, as it is imperative that you return to your primary care physician (or establish a relationship with a primary care physician if you do not have one) for your post hospital discharge needs so that they can reassess your need for medications and monitor your lab values.  ?   Person Under Monitoring Name: Miranda Gates  [place] [address] [place] [id number]   Infection Prevention Recommendations for Individuals Confirmed to have, or Being Evaluated for, 2019 Novel Coronavirus (COVID-19) Infection Who Receive Care at Home  Individuals who are confirmed to have, or are being evaluated for, COVID-19 should follow the prevention steps below until a healthcare provider or local or state health department says they can return to normal activities.  Stay home except to get medical care You should restrict activities outside your home, except for getting medical care. Do not go to work, school, or public areas, and do not use public transportation or taxis.  Call ahead before visiting your doctor Before your medical appointment, call the healthcare provider and tell them that you have, or are being evaluated for, COVID-19 infection. This will help the healthcare provider's office take steps to keep other people from getting infected. Ask your healthcare  provider to call the local or state health department.  Monitor your symptoms Seek prompt medical attention if your illness is worsening (e.g., difficulty breathing). Before going to your medical appointment, call the healthcare provider and tell them that you have, or are being evaluated for, COVID-19 infection. Ask your healthcare provider to call the local or state health department.  Wear a facemask You should wear a facemask that covers your nose and mouth when you are in the same room with other people and when you visit a healthcare provider. People who live with or visit you should also wear a facemask while they are in the same room with you.  Separate yourself from other people in your home As much as possible, you should stay in a different room from other people in your home. Also, you should use a separate bathroom, if available.  Avoid sharing household items You should not share dishes, drinking glasses, cups, eating utensils, towels, bedding, or other items with other people in your home. After using these items, you should wash them thoroughly with soap and water.  Cover your coughs and sneezes Cover your mouth and nose with a tissue when you cough or sneeze, or you can cough or sneeze into your sleeve. Throw used tissues in a lined trash can, and immediately wash your hands with soap and water for at least 20 seconds or use an alcohol-based hand rub.  Wash  your hands Wash your hands often and thoroughly with soap and water for at least 20 seconds. You can use an alcohol-based hand sanitizer if soap and water are not available and if your hands are not visibly dirty. Avoid touching your eyes, nose, and mouth with unwashed hands.   Prevention Steps for Caregivers and Household Members of Individuals Confirmed to have, or Being Evaluated for, COVID-19 Infection Being Cared for in the Home  If you live with, or provide care at home for, a person confirmed to have, or  being evaluated for, COVID-19 infection please follow these guidelines to prevent infection:  Follow healthcare provider's instructions Make sure that you understand and can help the patient follow any healthcare provider instructions for all care.  Provide for the patient's basic needs You should help the patient with basic needs in the home and provide support for getting groceries, prescriptions, and other personal needs.  Monitor the patient's symptoms If they are getting sicker, call his or her medical provider and tell them that the patient has, or is being evaluated for, COVID-19 infection. This will help the healthcare provider's office take steps to keep other people from getting infected. Ask the healthcare provider to call the local or state health department.  Limit the number of people who have contact with the patient If possible, have only one caregiver for the patient. Other household members should stay in another home or place of residence. If this is not possible, they should stay in another room, or be separated from the patient as much as possible. Use a separate bathroom, if available. Restrict visitors who do not have an essential need to be in the home.  Keep older adults, very young children, and other sick people away from the patient Keep older adults, very young children, and those who have compromised immune systems or chronic health conditions away from the patient. This includes people with chronic heart, lung, or kidney conditions, diabetes, and cancer.  Ensure good ventilation Make sure that shared spaces in the home have good air flow, such as from an air conditioner or an opened window, weather permitting.  Wash your hands often Wash your hands often and thoroughly with soap and water for at least 20 seconds. You can use an alcohol based hand sanitizer if soap and water are not available and if your hands are not visibly dirty. Avoid touching your  eyes, nose, and mouth with unwashed hands. Use disposable paper towels to dry your hands. If not available, use dedicated cloth towels and replace them when they become wet.  Wear a facemask and gloves Wear a disposable facemask at all times in the room and gloves when you touch or have contact with the patient's blood, body fluids, and/or secretions or excretions, such as sweat, saliva, sputum, nasal mucus, vomit, urine, or feces.  Ensure the mask fits over your nose and mouth tightly, and do not touch it during use. Throw out disposable facemasks and gloves after using them. Do not reuse. Wash your hands immediately after removing your facemask and gloves. If your personal clothing becomes contaminated, carefully remove clothing and launder. Wash your hands after handling contaminated clothing. Place all used disposable facemasks, gloves, and other waste in a lined container before disposing them with other household waste. Remove gloves and wash your hands immediately after handling these items.  Do not share dishes, glasses, or other household items with the patient Avoid sharing household items. You should not share dishes, drinking glasses,  cups, eating utensils, towels, bedding, or other items with a patient who is confirmed to have, or being evaluated for, COVID-19 infection. After the person uses these items, you should wash them thoroughly with soap and water.  Wash laundry thoroughly Immediately remove and wash clothes or bedding that have blood, body fluids, and/or secretions or excretions, such as sweat, saliva, sputum, nasal mucus, vomit, urine, or feces, on them. Wear gloves when handling laundry from the patient. Read and follow directions on labels of laundry or clothing items and detergent. In general, wash and dry with the warmest temperatures recommended on the label.  Clean all areas the individual has used often Clean all touchable surfaces, such as counters, tabletops,  doorknobs, bathroom fixtures, toilets, phones, keyboards, tablets, and bedside tables, every day. Also, clean any surfaces that may have blood, body fluids, and/or secretions or excretions on them. Wear gloves when cleaning surfaces the patient has come in contact with. Use a diluted bleach solution (e.g., dilute bleach with 1 part bleach and 10 parts water) or a household disinfectant with a label that says EPA-registered for coronaviruses. To make a bleach solution at home, add 1 tablespoon of bleach to 1 quart (4 cups) of water. For a larger supply, add  cup of bleach to 1 gallon (16 cups) of water. Read labels of cleaning products and follow recommendations provided on product labels. Labels contain instructions for safe and effective use of the cleaning product including precautions you should take when applying the product, such as wearing gloves or eye protection and making sure you have good ventilation during use of the product. Remove gloves and wash hands immediately after cleaning.  Monitor yourself for signs and symptoms of illness Caregivers and household members are considered close contacts, should monitor their health, and will be asked to limit movement outside of the home to the extent possible. Follow the monitoring steps for close contacts listed on the symptom monitoring form.   ? If you have additional questions, contact your local health department or call the epidemiologist on call at [phone] (available 24/7). ? This guidance is subject to change. For the most up-to-date guidance from [facility], please refer to their website: YouBlogs.pl   Increase activity slowly   Complete by: As directed      Allergies as of 11/12/2018   No Known Allergies     Medication List    TAKE these medications   albuterol 108 (90 Base) MCG/ACT inhaler Commonly known as: VENTOLIN HFA Inhale 2 puffs into the lungs every 6  (six) hours as needed for wheezing or shortness of breath.   benzonatate 200 MG capsule Commonly known as: TESSALON Take 1 capsule (200 mg total) by mouth 3 (three) times daily as needed for cough.   naphazoline-pheniramine 0.025-0.3 % ophthalmic solution Commonly known as: NAPHCON-A Place 1 drop into both eyes 4 (four) times daily as needed for eye irritation or allergies.   vitamin C 250 MG tablet Commonly known as: ASCORBIC ACID Take 250 mg by mouth daily.      Follow-up Information    Constant, Peggy, MD. Schedule an appointment as soon as possible for a visit in 1 week(s).   Specialty: Obstetrics and Gynecology Contact information: [place] [address] [phone]        Concha Pyo, [place]. Schedule an appointment as soon as possible for a visit in 1 week(s).   Specialty: Physician Assistant Contact information: [address] [place] [address] [phone]  No Known Allergies   Consultations:   None   Other Procedures/Studies: Ct Head Wo Contrast  Result Date: 11/01/2018 CLINICAL DATA:  43 y/o F; headache and fever, possible frontal sinusitis. EXAM: CT HEAD WITHOUT CONTRAST TECHNIQUE: Contiguous axial images were obtained from the base of the skull through the vertex without intravenous contrast. COMPARISON:  None. FINDINGS: Brain: No evidence of acute infarction, hemorrhage, hydrocephalus, extra-axial collection or mass lesion/mass effect. Vascular: No hyperdense vessel or unexpected calcification. Skull: Normal. Negative for fracture or focal lesion. Sinuses/Orbits: No acute finding. Other: None. IMPRESSION: Negative CT of the head.  No findings of sinusitis. Electronically Signed   By: Mitzi HansenLance  Furusawa-Stratton M.D.   On: 11/01/2018 21:37   Dg Chest Port 1 View  Result Date: 11/08/2018 CLINICAL DATA:  Respiratory distress.  COVID-19. EXAM: PORTABLE CHEST 1 VIEW COMPARISON:  No prior. FINDINGS: Mediastinum and  hilar structures normal. Heart size normal. Diffuse bilateral pulmonary interstitial infiltrates. Bibasilar atelectasis. No pleural effusion or pneumothorax. Degenerative change thoracic spine. IMPRESSION: Diffuse bilateral pulmonary interstitial infiltrates. Findings are consistent with viral pneumonitis. Bibasilar atelectasis. Electronically Signed   By: Maisie Fushomas  Register   On: 11/08/2018 07:02   Dg Chest Port 1 View  Result Date: 11/06/2018 CLINICAL DATA:  Dyspnea EXAM: PORTABLE CHEST 1 VIEW COMPARISON:  None. FINDINGS: Low lung volume. Borderline cardiomegaly. Vague peripheral opacity suspected in the left mid lung and bilateral bases. No pneumothorax. IMPRESSION: Suspicion of vague peripheral opacity in the left greater than right lung as may be seen with atypical or viral pneumonia. Electronically Signed   By: Jasmine PangKim  Fujinaga M.D.   On: 11/06/2018 19:51      TODAY-DAY OF DISCHARGE:  Subjective:   Miranda Gates today has no headache,no chest abdominal pain,no new weakness tingling or numbness, feels much better wants to go home today.   Objective:   Blood pressure (!) 91/46, pulse (!) 102, temperature 98.5 F (36.9 C), temperature source Oral, resp. rate 17, height 5\' 6"  (1.676 m), weight 77.1 kg, last menstrual period 10/23/2018, SpO2 91 %.  Intake/Output Summary (Last 24 hours) at 11/12/2018 0858 Last data filed at 11/11/2018 1800 Gross per 24 hour  Intake 600 ml  Output --  Net 600 ml   Filed Weights   11/06/18 1824 11/07/18 0842  Weight: 74.8 kg 77.1 kg    Exam: Awake Alert, Oriented *3, No new F.N deficits, Normal affect [place].AT,PERRAL Supple Neck,No JVD, No cervical lymphadenopathy appriciated.  Symmetrical Chest wall movement, Good air movement bilaterally, CTAB RRR,No Gallops,Rubs or new Murmurs, No Parasternal Heave +ve B.Sounds, Abd Soft, Non tender, No organomegaly appriciated, No rebound -guarding or rigidity. No Cyanosis, Clubbing or edema, No new Rash or  bruise   PERTINENT RADIOLOGIC STUDIES: Ct Head Wo Contrast  Result Date: 11/01/2018 CLINICAL DATA:  43 y/o F; headache and fever, possible frontal sinusitis. EXAM: CT HEAD WITHOUT CONTRAST TECHNIQUE: Contiguous axial images were obtained from the base of the skull through the vertex without intravenous contrast. COMPARISON:  None. FINDINGS: Brain: No evidence of acute infarction, hemorrhage, hydrocephalus, extra-axial collection or mass lesion/mass effect. Vascular: No hyperdense vessel or unexpected calcification. Skull: Normal. Negative for fracture or focal lesion. Sinuses/Orbits: No acute finding. Other: None. IMPRESSION: Negative CT of the head.  No findings of sinusitis. Electronically Signed   By: Mitzi HansenLance  Furusawa-Stratton M.D.   On: 11/01/2018 21:37   Dg Chest Port 1 View  Result Date: 11/08/2018 CLINICAL DATA:  Respiratory distress.  COVID-19. EXAM: PORTABLE CHEST 1 VIEW COMPARISON:  No  prior. FINDINGS: Mediastinum and hilar structures normal. Heart size normal. Diffuse bilateral pulmonary interstitial infiltrates. Bibasilar atelectasis. No pleural effusion or pneumothorax. Degenerative change thoracic spine. IMPRESSION: Diffuse bilateral pulmonary interstitial infiltrates. Findings are consistent with viral pneumonitis. Bibasilar atelectasis. Electronically Signed   By: Maisie Fus  Register   On: 11/08/2018 07:02   Dg Chest Port 1 View  Result Date: 11/06/2018 CLINICAL DATA:  Dyspnea EXAM: PORTABLE CHEST 1 VIEW COMPARISON:  None. FINDINGS: Low lung volume. Borderline cardiomegaly. Vague peripheral opacity suspected in the left mid lung and bilateral bases. No pneumothorax. IMPRESSION: Suspicion of vague peripheral opacity in the left greater than right lung as may be seen with atypical or viral pneumonia. Electronically Signed   By: Jasmine Pang M.D.   On: 11/06/2018 19:51     PERTINENT LAB RESULTS: CBC: Recent Labs    11/11/18 0304 11/12/18 0300  WBC 6.1 7.6  HGB 13.2 12.4  HCT 40.4  38.4  PLT 424* 419*   CMET CMP     Component Value Date/Time   NA 138 11/12/2018 0300   K 4.3 11/12/2018 0300   CL 106 11/12/2018 0300   CO2 23 11/12/2018 0300   GLUCOSE 100 (H) 11/12/2018 0300   BUN 18 11/12/2018 0300   CREATININE 0.58 11/12/2018 0300   CALCIUM 8.3 (L) 11/12/2018 0300   PROT 6.4 (L) 11/12/2018 0300   ALBUMIN 2.8 (L) 11/12/2018 0300   AST 35 11/12/2018 0300   ALT 139 (H) 11/12/2018 0300   ALKPHOS 58 11/12/2018 0300   BILITOT 0.2 (L) 11/12/2018 0300   GFRNONAA >60 11/12/2018 0300   GFRAA >60 11/12/2018 0300    GFR Estimated Creatinine Clearance: 94.1 mL/min (by C-G formula based on SCr of 0.58 mg/dL). No results for input(s): LIPASE, AMYLASE in the last 72 hours. No results for input(s): CKTOTAL, CKMB, CKMBINDEX, TROPONINI in the last 72 hours. Invalid input(s): POCBNP Recent Labs    11/11/18 0304 11/12/18 0300  DDIMER 0.84* 0.73*   No results for input(s): HGBA1C in the last 72 hours. No results for input(s): CHOL, HDL, LDLCALC, TRIG, CHOLHDL, LDLDIRECT in the last 72 hours. No results for input(s): TSH, T4TOTAL, T3FREE, THYROIDAB in the last 72 hours.  Invalid input(s): FREET3 Recent Labs    11/11/18 0304 11/12/18 0300  FERRITIN 211 116   Coags: No results for input(s): INR in the last 72 hours.  Invalid input(s): PT Microbiology: Recent Results (from the past 240 hour(s))  Blood Culture (routine x 2)     Status: None   Collection Time: 11/06/18  6:51 PM   Specimen: BLOOD LEFT ARM  Result Value Ref Range Status   Specimen Description   Final    BLOOD LEFT ARM Performed at [facility], [address] [place], [place] [address]    Special Requests   Final    BOTTLES DRAWN AEROBIC AND ANAEROBIC Blood Culture adequate volume Performed at [facility], [address] [place], [place] [address]    Culture   Final    NO GROWTH 5 DAYS Performed at [facility], [address], [place], [place] [address]     Report Status 11/11/2018 FINAL  Final  Blood Culture (routine x 2)     Status: None   Collection Time: 11/06/18  7:20 PM   Specimen: BLOOD RIGHT ARM  Result Value Ref Range Status   Specimen Description   Final    BLOOD RIGHT ARM Performed at [facility],  [address] [place], [place] [address]    Special Requests   Final    BOTTLES DRAWN AEROBIC AND ANAEROBIC Blood Culture adequate volume Performed at [facility], [address] [place], [place] [address]    Culture   Final    NO GROWTH 5 DAYS Performed at [facility], [address] [place], [place] [address]    Report Status 11/11/2018 FINAL  Final    FURTHER DISCHARGE INSTRUCTIONS:  Get Medicines reviewed and adjusted: Please take all your medications with you for your next visit with your Primary MD  Laboratory/radiological data: Please request your Primary MD to go over all hospital tests and procedure/radiological results at the follow up, please ask your Primary MD to get all Hospital records sent to his/her office.  In some cases, they will be blood work, cultures and biopsy results pending at the time of your discharge. Please request that your primary care M.D. goes through all the records of your hospital data and follows up on these results.  Also Note the following: If you experience worsening of your admission symptoms, develop shortness of breath, life threatening emergency, suicidal or homicidal thoughts you must seek medical attention immediately by calling 911 or calling your MD immediately  if symptoms less severe.  You must read complete instructions/literature along with all the possible adverse reactions/side effects for all the Medicines you take and that have been prescribed to you. Take any new Medicines after you have completely understood and accpet all the possible adverse reactions/side effects.   Do not drive when taking Pain medications or sleeping medications  (Benzodaizepines)  Do not take more than prescribed Pain, Sleep and Anxiety Medications. It is not advisable to combine anxiety,sleep and pain medications without talking with your primary care practitioner  Special Instructions: If you have smoked or chewed Tobacco  in the last 2 yrs please stop smoking, stop any regular Alcohol  and or any Recreational drug use.  Wear Seat belts while driving.  Please note: You were cared for by a hospitalist during your hospital stay. Once you are discharged, your primary care physician will handle any further medical issues. Please note that NO REFILLS for any discharge medications will be authorized once you are discharged, as it is imperative that you return to your primary care physician (or establish a relationship with a primary care physician if you do not have one) for your post hospital discharge needs so that they can reassess your need for medications and monitor your lab values.  Total Time spent coordinating discharge including counseling, education and face to face time equals 35 minutes.  SignedJeoffrey Massed 11/12/2018 8:58 AM

## 2018-11-12 NOTE — Progress Notes (Signed)
Interpreter used to give pt discharge instructions and discuss medications.  Notified to follow up with PCP Dr Elly Modena within one week.  Educated on infection prevention recommendations.  All belongings returned to patient.  All questions answered.

## 2018-11-22 ENCOUNTER — Telehealth: Payer: Self-pay

## 2018-11-22 NOTE — Telephone Encounter (Signed)
I spoke with Miranda Gates using [phone] [employer] [facility] [phone]) about scheduling a follow-up appointment with her PCP.  Miranda Gates stated that she is starting to feel better and that she is self quarantining at home.  She also stated that she is planning on scheduling an appointment this week with [place].  I advised patient to keep any scheduled appointment that she obtains.

## 2019-10-24 IMAGING — CT CT HEAD WITHOUT CONTRAST
3 series · 16 of 47 positions shown, 19 images · non-contrast
Comparison: None.

CLINICAL DATA: 42 y/o F; headache and fever, possible frontal
sinusitis.

EXAM:
CT HEAD WITHOUT CONTRAST
TECHNIQUE: Contiguous axial images were obtained from the base of the skull
through the vertex without intravenous contrast.

[Series 2: head wo · axial · 0.39mm/px · z∈[+1250,+1390]mm · 10 of 34 slices shown, 13 images]
[im 3/34  brain]
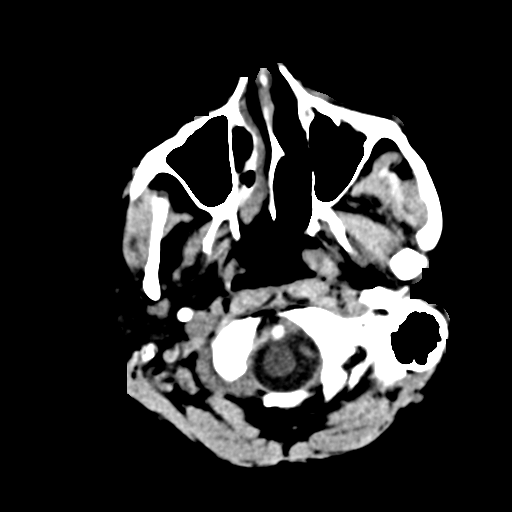
[im 3/34  bone]
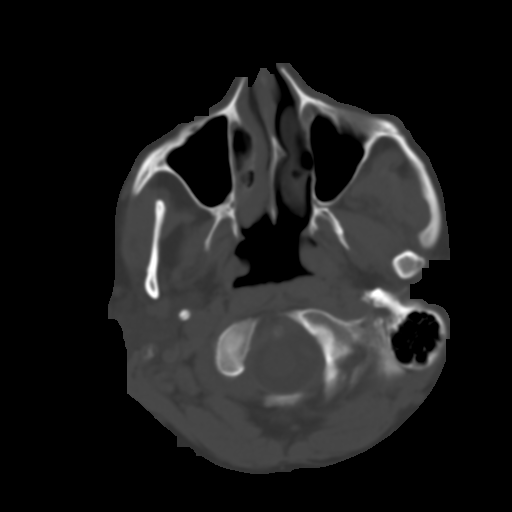
[im 6/34  brain]
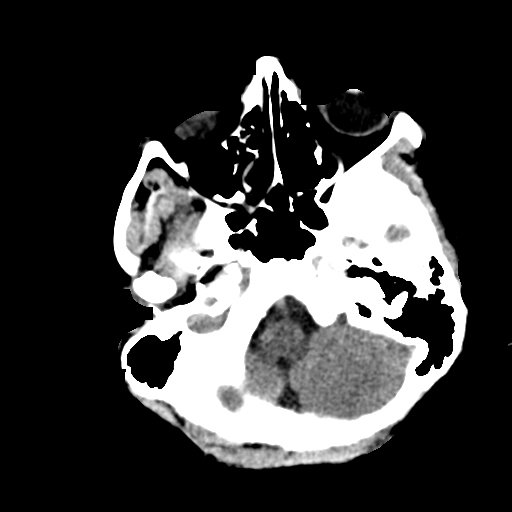
[im 10/34  brain]
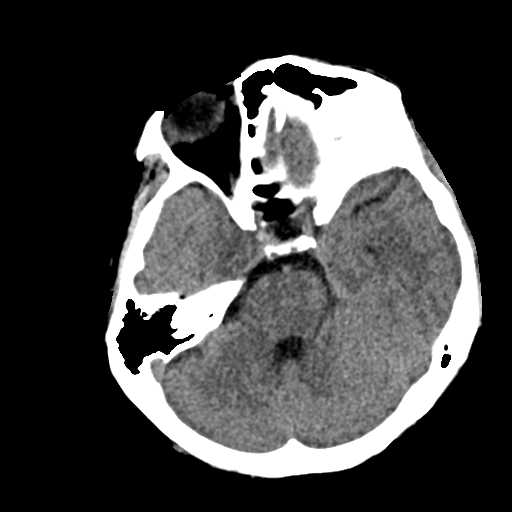
[im 12/34  brain]
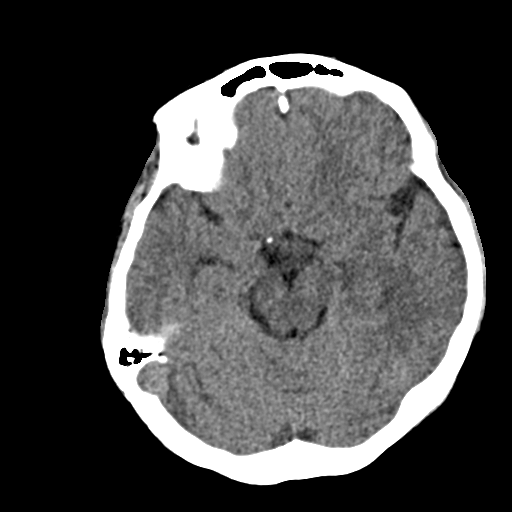
[im 15/34  brain]
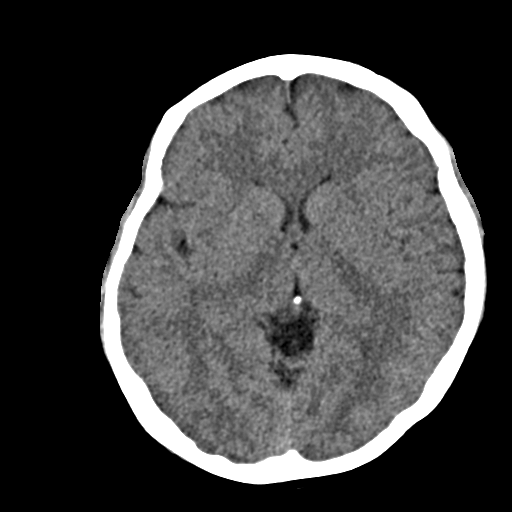
[im 15/34  bone]
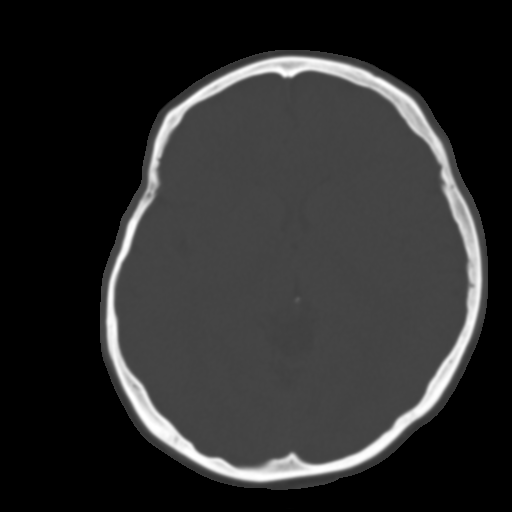
[im 19/34  brain]
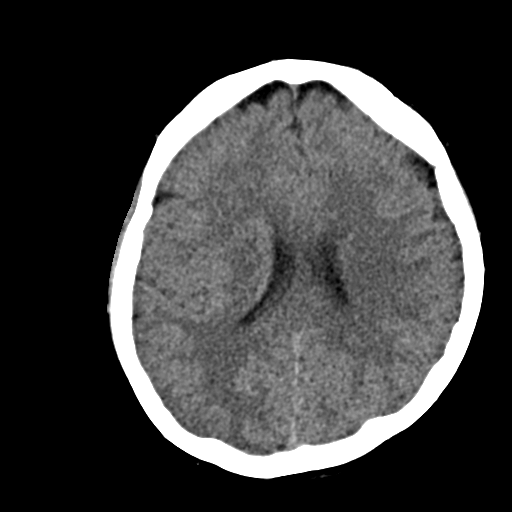
[im 22/34  brain]
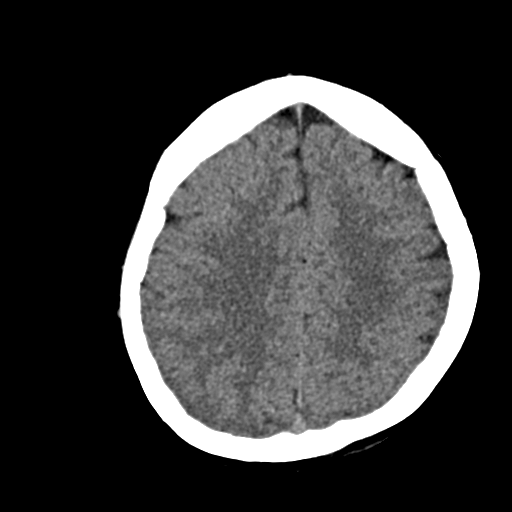
[im 26/34  brain]
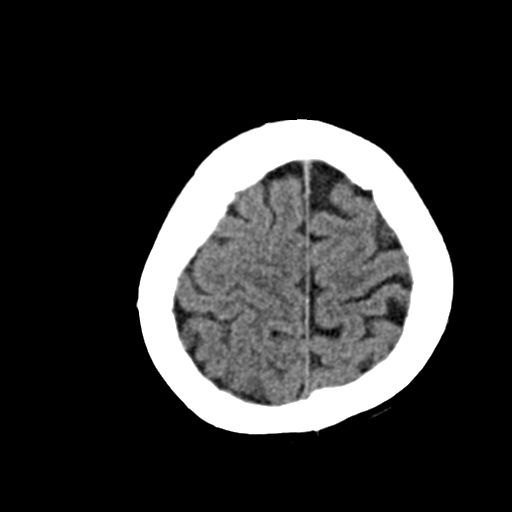
[im 28/34  brain]
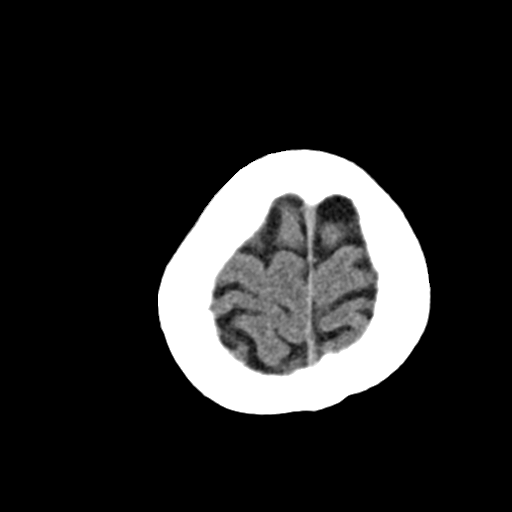
[im 28/34  bone]
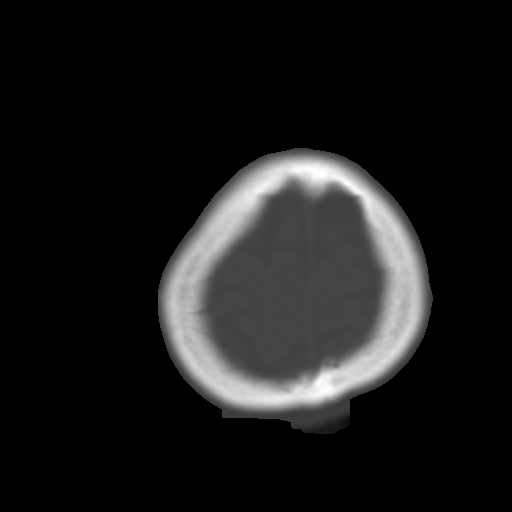
[im 31/34  brain]
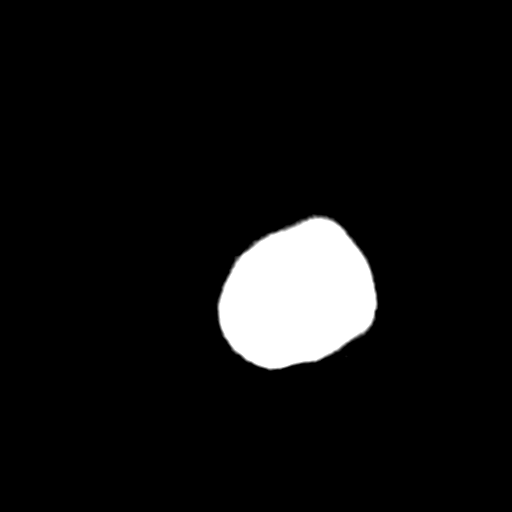

[Series 4: coronal soft · coronal · 0.33mm/px · 3 of 67 slices shown]
[im 23/67  brain]
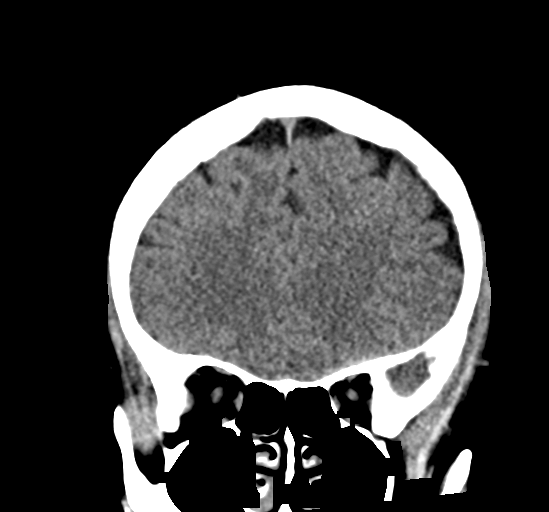
[im 30/67  brain]
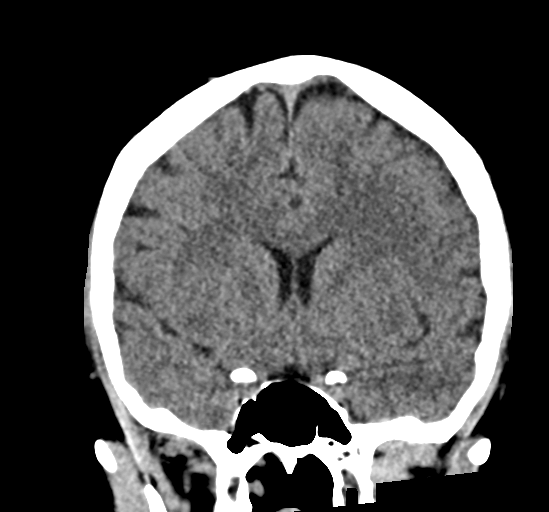
[im 37/67  brain]
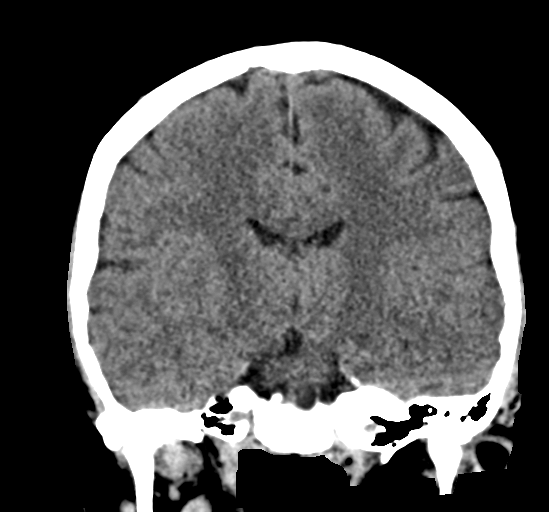

[Series 5: sag soft · sagittal · 0.33mm/px · 3 of 61 slices shown]
[im 21/61  brain]
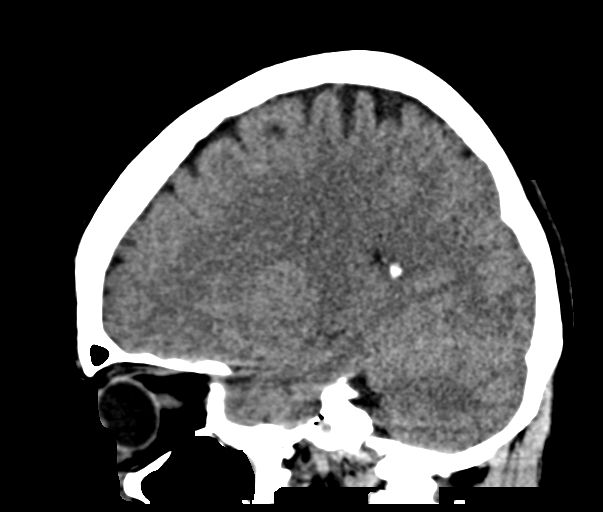
[im 31/61  brain]
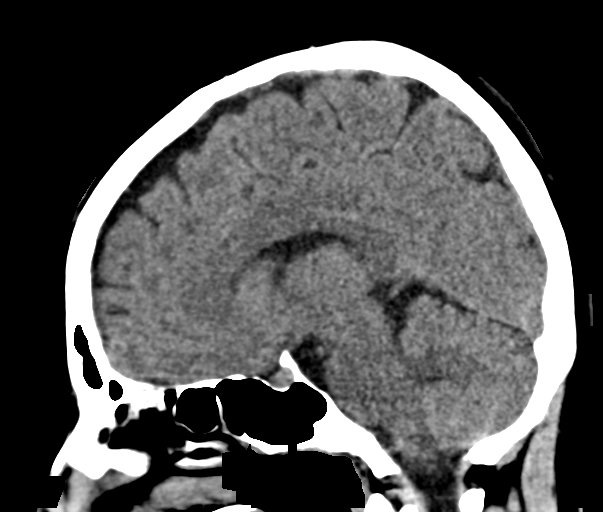
[im 40/61  brain]
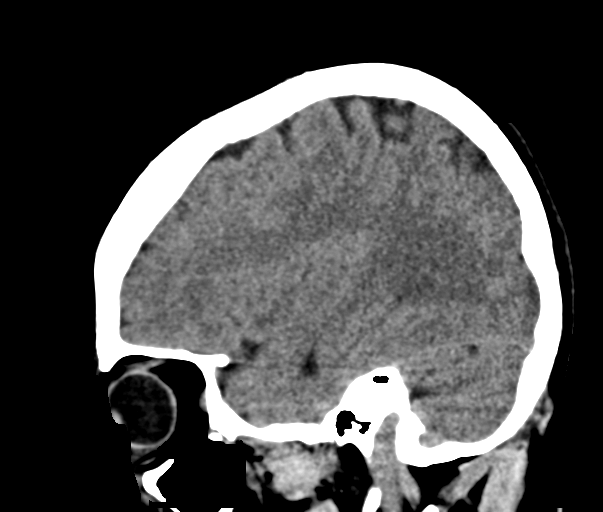

[16 of 47 positions shown; findings below may reference images not displayed]

FINDINGS: Brain: No evidence of acute infarction, hemorrhage, hydrocephalus,
extra-axial collection or mass lesion/mass effect.

Vascular: No hyperdense vessel or unexpected calcification.

Skull: Normal. Negative for fracture or focal lesion.

Sinuses/Orbits: No acute finding.

Other: None.
IMPRESSION: Negative CT of the head.  No findings of sinusitis.

## 2019-10-29 IMAGING — DX PORTABLE CHEST - 1 VIEW
1 series · 1 of 1 positions shown · non-contrast
Comparison: None.

CLINICAL DATA: Dyspnea

EXAM:
PORTABLE CHEST 1 VIEW

[chest ap]
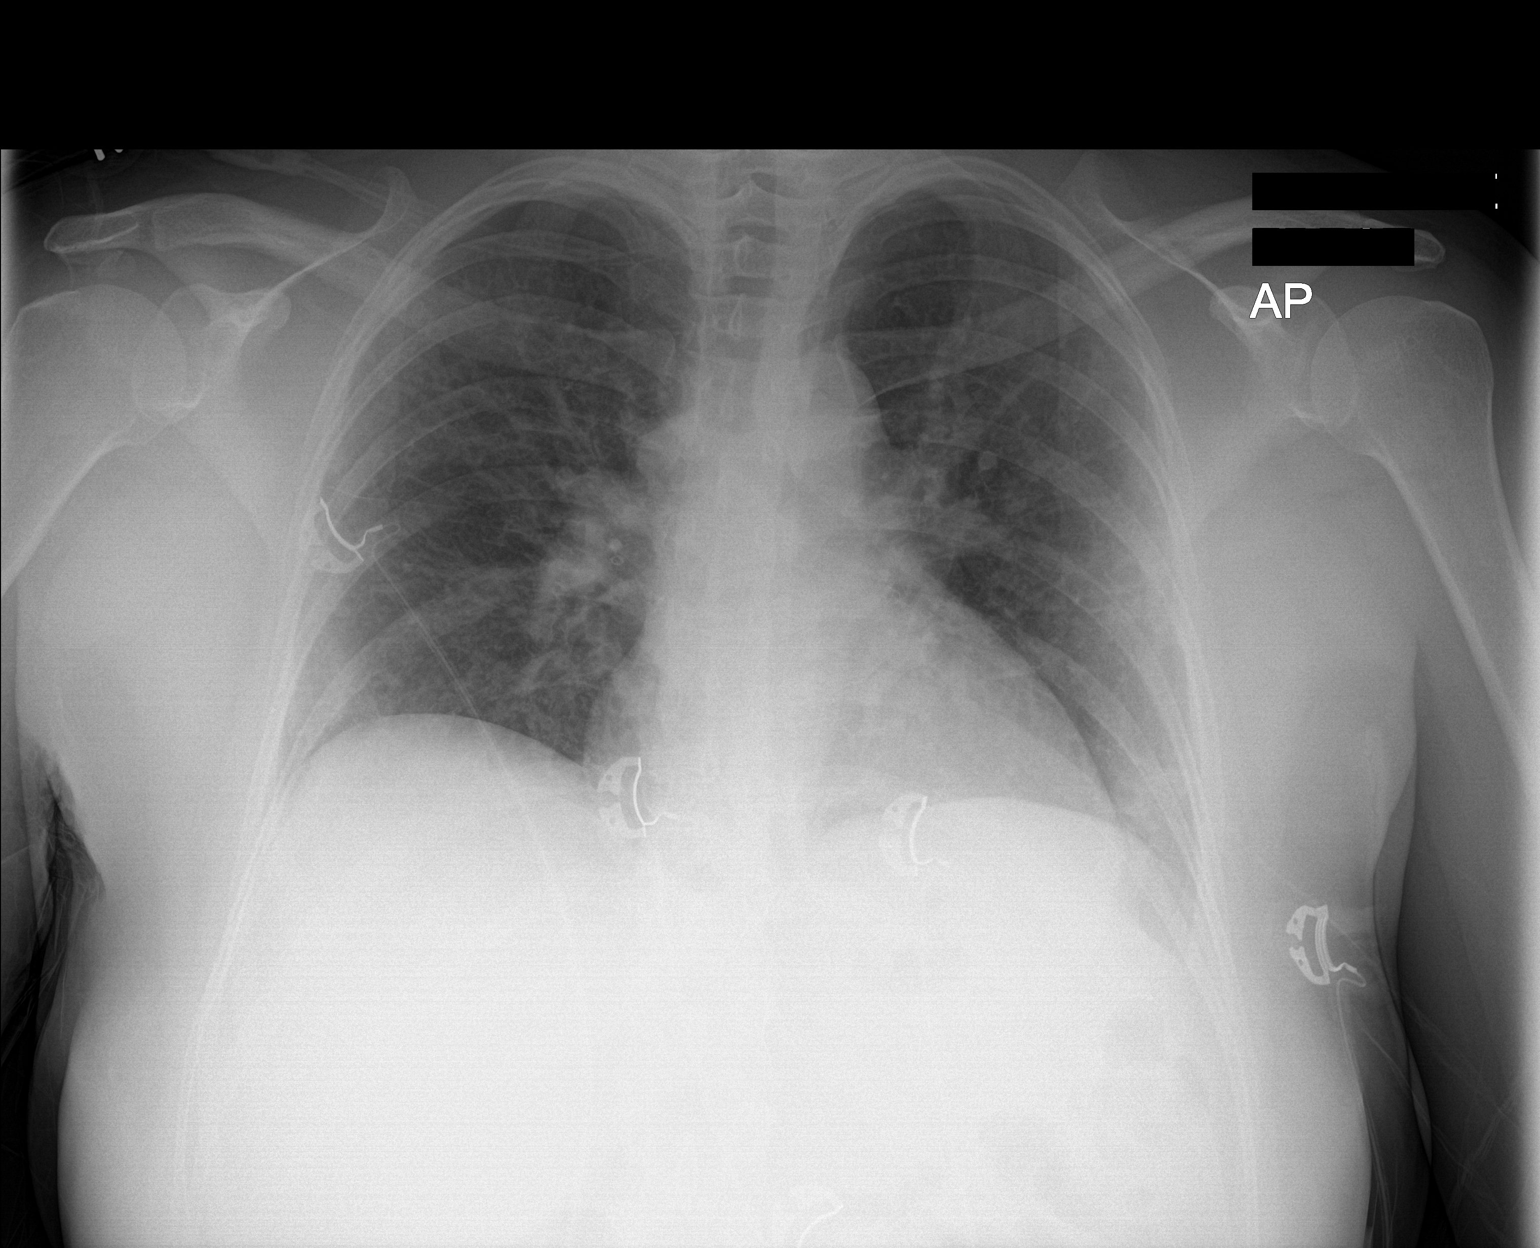

[1 of 1 positions shown; findings below may reference images not displayed]

FINDINGS: Low lung volume. Borderline cardiomegaly. Vague peripheral opacity
suspected in the left mid lung and bilateral bases. No pneumothorax.
IMPRESSION: Suspicion of vague peripheral opacity in the left greater than right
lung as may be seen with atypical or viral pneumonia.

## 2020-02-27 ENCOUNTER — Ambulatory Visit: Payer: No Typology Code available for payment source | Attending: Internal Medicine

## 2020-02-27 DIAGNOSIS — Z23 Encounter for immunization: Secondary | ICD-10-CM

## 2020-02-27 NOTE — Progress Notes (Signed)
   [facility]  Name:  Miranda Gates    MRN: [id number] DOB: 11-13-1975  02/27/2020  [name] was observed post Covid-19 immunization for 15 minutes without incident. She was provided with Vaccine Information Sheet and instruction to access the [employer]-Safe system.   [name] was instructed to call 911 with any severe reactions post vaccine: Marland Kitchen Difficulty breathing  . Swelling of face and throat  . A fast heartbeat  . A bad rash all over body  . Dizziness and weakness
